# Patient Record
Sex: Male | Born: 1975 | Race: White | Hispanic: No | Marital: Single | Smoking: Never smoker
Health system: Southern US, Community
[De-identification: ages and names within clinical notes are randomized; demographics above are authoritative.]

---

## 2019-05-01 ENCOUNTER — Emergency Department (HOSPITAL_COMMUNITY): Payer: Self-pay

## 2019-05-01 ENCOUNTER — Other Ambulatory Visit: Payer: Self-pay

## 2019-05-01 ENCOUNTER — Encounter (HOSPITAL_COMMUNITY): Payer: Self-pay | Admitting: Emergency Medicine

## 2019-05-01 ENCOUNTER — Inpatient Hospital Stay (HOSPITAL_COMMUNITY)
Admission: EM | Admit: 2019-05-01 | Discharge: 2019-05-07 | DRG: 988 | Disposition: A | Discharge status: Home / Self Care | Payer: Self-pay | Attending: Internal Medicine | Admitting: Internal Medicine

## 2019-05-01 DIAGNOSIS — Y929 Unspecified place or not applicable: Secondary | ICD-10-CM

## 2019-05-01 DIAGNOSIS — L03114 Cellulitis of left upper limb: Secondary | ICD-10-CM | POA: Diagnosis present

## 2019-05-01 DIAGNOSIS — Z1159 Encounter for screening for other viral diseases: Secondary | ICD-10-CM

## 2019-05-01 DIAGNOSIS — L02414 Cutaneous abscess of left upper limb: Principal | ICD-10-CM | POA: Diagnosis present

## 2019-05-01 DIAGNOSIS — W230XXA Caught, crushed, jammed, or pinched between moving objects, initial encounter: Secondary | ICD-10-CM | POA: Diagnosis present

## 2019-05-01 DIAGNOSIS — F17200 Nicotine dependence, unspecified, uncomplicated: Secondary | ICD-10-CM | POA: Diagnosis present

## 2019-05-01 DIAGNOSIS — M25439 Effusion, unspecified wrist: Secondary | ICD-10-CM

## 2019-05-01 DIAGNOSIS — S61432A Puncture wound without foreign body of left hand, initial encounter: Secondary | ICD-10-CM | POA: Diagnosis present

## 2019-05-01 DIAGNOSIS — Y9389 Activity, other specified: Secondary | ICD-10-CM

## 2019-05-01 DIAGNOSIS — L02512 Cutaneous abscess of left hand: Secondary | ICD-10-CM | POA: Diagnosis present

## 2019-05-01 LAB — COMPREHENSIVE METABOLIC PANEL
ALT: 59 U/L — ABNORMAL HIGH (ref 0–44)
AST: 45 U/L — ABNORMAL HIGH (ref 15–41)
Albumin: 3.4 g/dL — ABNORMAL LOW (ref 3.5–5.0)
Alkaline Phosphatase: 85 U/L (ref 38–126)
Anion gap: 11 (ref 5–15)
BUN: 6 mg/dL (ref 6–20)
CO2: 26 mmol/L (ref 22–32)
Calcium: 8.7 mg/dL — ABNORMAL LOW (ref 8.9–10.3)
Chloride: 96 mmol/L — ABNORMAL LOW (ref 98–111)
Creatinine, Ser: 0.75 mg/dL (ref 0.61–1.24)
GFR calc Af Amer: >60 mL/min (ref >60)
GFR calc non Af Amer: >60 mL/min (ref >60)
Glucose, Bld: 132 mg/dL — ABNORMAL HIGH (ref 70–99)
Potassium: 4 mmol/L (ref 3.5–5.1)
Sodium: 133 mmol/L — ABNORMAL LOW (ref 135–145)
Total Bilirubin: 0.6 mg/dL (ref 0.3–1.2)
Total Protein: 6.7 g/dL (ref 6.5–8.1)

## 2019-05-01 LAB — CBC WITH DIFFERENTIAL/PLATELET
Abs Immature Granulocytes: 0.01 10*3/uL (ref 0.00–0.07)
Basophils Absolute: 0 10*3/uL (ref 0.0–0.1)
Basophils Relative: 1 %
Eosinophils Absolute: 0.1 10*3/uL (ref 0.0–0.5)
Eosinophils Relative: 2 %
HCT: 49.6 % (ref 39.0–52.0)
Hemoglobin: 16.6 g/dL (ref 13.0–17.0)
Immature Granulocytes: 0 %
Lymphocytes Relative: 15 %
Lymphs Abs: 0.9 10*3/uL (ref 0.7–4.0)
MCH: 31.4 pg (ref 26.0–34.0)
MCHC: 33.5 g/dL (ref 30.0–36.0)
MCV: 93.9 fL (ref 80.0–100.0)
Monocytes Absolute: 0.8 10*3/uL (ref 0.1–1.0)
Monocytes Relative: 14 %
Neutro Abs: 4 10*3/uL (ref 1.7–7.7)
Neutrophils Relative %: 68 %
Platelets: 190 10*3/uL (ref 150–400)
RBC: 5.28 MIL/uL (ref 4.22–5.81)
RDW: 12.8 % (ref 11.5–15.5)
WBC: 5.8 10*3/uL (ref 4.0–10.5)
nRBC: 0 % (ref 0.0–0.2)

## 2019-05-01 LAB — C-REACTIVE PROTEIN: CRP: 27.5 mg/dL — ABNORMAL HIGH (ref <1.0)

## 2019-05-01 LAB — LACTIC ACID, PLASMA: Lactic Acid, Venous: 1 mmol/L (ref 0.5–1.9)

## 2019-05-01 LAB — SEDIMENTATION RATE: Sed Rate: 10 mm/hr (ref 0–16)

## 2019-05-01 MED ORDER — CLINDAMYCIN PHOSPHATE 900 MG/50ML IV SOLN
900.0000 mg | Freq: Once | INTRAVENOUS | Status: AC
  Administered 2019-05-01: 900 mg via INTRAVENOUS
  Filled 2019-05-01: qty 50

## 2019-05-01 MED ORDER — IOHEXOL 300 MG/ML  SOLN
100.0000 mL | Freq: Once | INTRAMUSCULAR | Status: AC | PRN
  Administered 2019-05-01: 100 mL via INTRAVENOUS

## 2019-05-01 MED ORDER — TETANUS-DIPHTHERIA TOXOIDS TD 5-2 LFU IM INJ
0.5000 mL | INJECTION | Freq: Once | INTRAMUSCULAR | Status: AC
  Administered 2019-05-01: 0.5 mL via INTRAMUSCULAR
  Filled 2019-05-01: qty 0.5

## 2019-05-01 NOTE — ED Provider Notes (Signed)
[facility] EMERGENCY DEPARTMENT Provider Note   CSN: [id number] Arrival date & time: 05/01/19  1741    History   Chief Complaint Chief Complaint  Patient presents with  . Wrist Swelling/drainage    HPI Frank Blackwell is a 43 y.o. male.     The history is provided by the patient and medical records.  Hand Injury Location:  Hand Hand location:  Dorsum of L hand and L hand Injury: yes   Time since incident:  5 days Mechanism of injury: crush   Crush injury:    Mechanism: working on car and car part fell on hand. Pain details:    Quality:  Unable to specify   Radiates to:  Does not radiate   Severity:  Unable to specify   Onset quality:  Sudden   Duration:  5 days   Timing:  Constant   Progression:  Worsening Foreign body present:  Unable to specify Tetanus status:  Unknown Prior injury to area:  No Relieved by:  Nothing Worsened by:  Movement and stretching area Ineffective treatments:  Rest, ice, elevation and immobilization Associated symptoms: decreased range of motion, numbness and swelling   Associated symptoms: no fatigue and no fever   Risk factors: no concern for non-accidental trauma and no frequent fractures     History reviewed. No pertinent past medical history.  There are no active problems to display for this patient.   History reviewed. No pertinent surgical history.      Home Medications    Prior to Admission medications   Not on File    Family History No family history on file.  Social History Social History   Tobacco Use  . Smoking status: Never Smoker  . Smokeless tobacco: Never Used  Substance Use Topics  . Alcohol use: Yes  . Drug use: Never     Allergies   Patient has no known allergies.   Review of Systems Review of Systems  Constitutional: Negative for fatigue and fever.  All other systems reviewed and are negative.    Physical Exam Updated Vital Signs BP 135/87   Pulse 82   Temp 98.6 F  (37 C)   Resp 18   Wt 113.4 kg   SpO2 98%   Physical Exam Vitals signs and nursing note reviewed.  Constitutional:      Appearance: He is well-developed.  HENT:     Head: Normocephalic and atraumatic.  Eyes:     Conjunctiva/sclera: Conjunctivae normal.  Neck:     Musculoskeletal: Neck supple.  Cardiovascular:     Rate and Rhythm: Normal rate.  Pulmonary:     Effort: Pulmonary effort is normal. No respiratory distress.  Abdominal:     Palpations: Abdomen is soft.     Tenderness: There is no abdominal tenderness.  Musculoskeletal:        General: Swelling and tenderness present.     Comments: Erythema and swelling to dorsum of left hand, wrist, swelling is not circumferential Patient able to move fingers and wrist bilateral upper extremities spontaneously, range of motion left hand and wrist decreased secondary to swelling  No obvious tenderness with palpation of wrist or hand  2+ radial and ulnar pulses bilateral upper extremities  Spontaneous purulent drainage left hand  Skin:    General: Skin is warm and dry.     Capillary Refill: Capillary refill takes less than 2 seconds.  Neurological:     Mental Status: He is alert and oriented to person, place,  and time.      ED Treatments / Results  Labs (all labs ordered are listed, but only abnormal results are displayed) Labs Reviewed  C-REACTIVE PROTEIN - Abnormal; Notable for the following components:      Result Value   CRP 27.5 (*)    All other components within normal limits  COMPREHENSIVE METABOLIC PANEL - Abnormal; Notable for the following components:   Sodium 133 (*)    Chloride 96 (*)    Glucose, Bld 132 (*)    Calcium 8.7 (*)    Albumin 3.4 (*)    AST 45 (*)    ALT 59 (*)    All other components within normal limits  CULTURE, BLOOD (ROUTINE X 2)  CULTURE, BLOOD (ROUTINE X 2)  SARS CORONAVIRUS 2 (HOSPITAL ORDER, PERFORMED IN [facility])  SEDIMENTATION RATE  LACTIC ACID, PLASMA  CBC WITH  DIFFERENTIAL/PLATELET    EKG None  Radiology Dg Forearm Left  Result Date: 05/01/2019 CLINICAL DATA:  Swelling arm. Rule out septic joint. Injury 5 days ago EXAM: LEFT FOREARM - 2 VIEW COMPARISON:  None. FINDINGS: There is no evidence of fracture or other focal bone lesions. Soft tissues are unremarkable. IMPRESSION: Negative. Electronically Signed   By: Marlan Palauharles  Clark M.D.   On: 05/01/2019 19:00   Dg Wrist Complete Left  Result Date: 05/01/2019 CLINICAL DATA:  Left wrist pain in distal forearm pain. EXAM: LEFT WRIST - COMPLETE 3+ VIEW COMPARISON:  None. FINDINGS: There is no evidence of fracture or dislocation. There is no evidence of arthropathy or other focal bone abnormality. Soft tissues are unremarkable. IMPRESSION: Negative. Electronically Signed   By: Ted Mcalpineobrinka  Dimitrova M.D.   On: 05/01/2019 18:51   Ct Wrist Left W Contrast  Result Date: 05/01/2019 CLINICAL DATA:  Hand swelling, osteomyelitis suspected EXAM: CT OF THE UPPER LEFT EXTREMITY WITH CONTRAST TECHNIQUE: Multidetector CT imaging of the upper left extremity was performed according to the standard protocol following intravenous contrast administration. COMPARISON:  None. CONTRAST:  100mL OMNIPAQUE IOHEXOL 300 MG/ML  SOLN FINDINGS: Soft tissue swelling noted along the dorsum of the hand and wrist. Stranding within the subcutaneous soft tissues. No bone destruction to suggest osteomyelitis. No focal drainable fluid collection. No fracture, subluxation or dislocation. IMPRESSION: No acute bony abnormality.  No changes of osteomyelitis. Diffuse soft tissue swelling along the dorsum of the hand and wrist without drainable focal fluid collection. Electronically Signed   By: Charlett NoseKevin  Dover M.D.   On: 05/01/2019 22:53    Procedures Ultrasound ED Soft Tissue  Date/Time: 05/01/2019 11:00 PM Performed by: Erick Alleyasey, Aerika Groll, MD Authorized by: Erick Alleyasey, Derrall Hicks, MD   Procedure details:    Indications: evaluate for cellulitis     Transverse view:   Visualized   Longitudinal view:  Visualized   Images: archived   Location:    Location: upper extremity     Side:  Left Findings:     no abscess present    cellulitis present   (including critical care time)  Medications Ordered in ED Medications  clindamycin (CLEOCIN) IVPB 900 mg (0 mg Intravenous Stopped 05/01/19 2238)  tetanus & diphtheria toxoids (adult) (TENIVAC) injection 0.5 mL (0.5 mLs Intramuscular Given 05/01/19 2211)  iohexol (OMNIPAQUE) 300 MG/ML solution 100 mL (100 mLs Intravenous Contrast Given 05/01/19 2230)     Initial Impression / Assessment and Plan / ED Course  I have reviewed the triage vital signs and the nursing notes.  Pertinent labs & imaging results that were available during  my care of the patient were reviewed by me and considered in my medical decision making (see chart for details).        Medical Decision Making: Daniel NonesBrian Wolter is a 43 y.o. male who presented to the ED today with left hand, wrist erythema and swelling.  Reviewed and confirmed nursing documentation for past medical history, family history, social history.  On my initial exam, the pt was calm, cooperative, conversant, follows commands appropriately, GCS 15, not tachycardic, not hypotensive, afebrile, no increased work of breathing or respiratory distress, no signs of impending respiratory failure.   Concern for worsening infection left hand/wrist, erythema and swelling over the last few days since injury while working on a car, developed purulent drainage today, x-rays reveal no obvious fracture, dislocation, foreign body, no obvious tenderness with palpation Inflammatory markers elevated without any significant leukocytosis Patient given IV antibiotics. Ultrasound revealed changes consistent with cellulitis Will obtain CT scan to assess for abscess or drainable collection CT scan negative for any fluid collection All radiology and laboratory studies reviewed independently and with my  attending physician, agree with reading provided by radiologist unless otherwise noted.  Upon reassessing patient, patient was calm, resting comfortably, no new complaints Based on the above findings, I believe patient requires admission. Pt admitted The above care was discussed with and agreed upon by my attending physician. Emergency Department Medication Summary:  Medications  clindamycin (CLEOCIN) IVPB 900 mg (0 mg Intravenous Stopped 05/01/19 2238)  tetanus & diphtheria toxoids (adult) (TENIVAC) injection 0.5 mL (0.5 mLs Intramuscular Given 05/01/19 2211)  iohexol (OMNIPAQUE) 300 MG/ML solution 100 mL (100 mLs Intravenous Contrast Given 05/01/19 2230)   Final Clinical Impressions(s) / ED Diagnoses   Final diagnoses:  None    ED Discharge Orders    None       Erick Alleyasey, Tristian Bouska, MD 05/01/19 [id number]    Pricilla LovelessGoldston, Scott, MD 05/02/19 1226

## 2019-05-01 NOTE — ED Triage Notes (Signed)
Pt in with L wrist swelling/purulent drainage - sent from UC. Hx of contusion 5 days ago. Concerned for rapid onset of swelling and drainage to area 24 hrs ago. Concern for tenosynovitis w/likely intervention by hand surgeon ASAP today

## 2019-05-01 NOTE — Progress Notes (Signed)
Patient with dorsal FA/wrist swelling, elevated CRP, normal WBC and very slightly elevated ESR.    Asked for guidance regarding determining indication for hand surgery involvement--contrasted CT scan recommended to help determine presence of surgically-addressable abscess.  CT reveals SQ edema with no surgically-addressable fluid collection noted.    Patient being admitted to hospitalist care for parenteral antibiotics for presumed cellulitis.  Please re-consult hand surgery if need for surgical intervention emerges.  Micheline Rough, MD Hand Surgery

## 2019-05-02 ENCOUNTER — Other Ambulatory Visit: Payer: Self-pay

## 2019-05-02 ENCOUNTER — Encounter (HOSPITAL_COMMUNITY): Payer: Self-pay | Admitting: Internal Medicine

## 2019-05-02 LAB — CBC
HCT: 44.2 % (ref 39.0–52.0)
Hemoglobin: 15 g/dL (ref 13.0–17.0)
MCH: 30.9 pg (ref 26.0–34.0)
MCHC: 33.9 g/dL (ref 30.0–36.0)
MCV: 91.1 fL (ref 80.0–100.0)
Platelets: 164 10*3/uL (ref 150–400)
RBC: 4.85 MIL/uL (ref 4.22–5.81)
RDW: 12.8 % (ref 11.5–15.5)
WBC: 5.3 10*3/uL (ref 4.0–10.5)
nRBC: 0 % (ref 0.0–0.2)

## 2019-05-02 LAB — BASIC METABOLIC PANEL
Anion gap: 10 (ref 5–15)
BUN: 6 mg/dL (ref 6–20)
CO2: 26 mmol/L (ref 22–32)
Calcium: 8.3 mg/dL — ABNORMAL LOW (ref 8.9–10.3)
Chloride: 96 mmol/L — ABNORMAL LOW (ref 98–111)
Creatinine, Ser: 0.64 mg/dL (ref 0.61–1.24)
GFR calc Af Amer: >60 mL/min (ref >60)
GFR calc non Af Amer: >60 mL/min (ref >60)
Glucose, Bld: 112 mg/dL — ABNORMAL HIGH (ref 70–99)
Potassium: 4 mmol/L (ref 3.5–5.1)
Sodium: 132 mmol/L — ABNORMAL LOW (ref 135–145)

## 2019-05-02 LAB — SARS CORONAVIRUS 2: SARS Coronavirus 2: NOT DETECTED

## 2019-05-02 LAB — HIV ANTIBODY (ROUTINE TESTING W REFLEX): HIV Screen 4th Generation wRfx: NONREACTIVE

## 2019-05-02 MED ORDER — ACETAMINOPHEN 325 MG PO TABS
650.0000 mg | ORAL_TABLET | Freq: Four times a day (QID) | ORAL | Status: DC | PRN
  Administered 2019-05-02 – 2019-05-04 (×6): 650 mg via ORAL
  Filled 2019-05-02 (×7): qty 2

## 2019-05-02 MED ORDER — SODIUM CHLORIDE 0.9 % IV SOLN: 2.0000 g | INTRAVENOUS | Status: DC

## 2019-05-02 MED ORDER — ONDANSETRON HCL 4 MG PO TABS: 4.0000 mg | ORAL_TABLET | Freq: Four times a day (QID) | ORAL | Status: DC | PRN

## 2019-05-02 MED ORDER — OXYCODONE-ACETAMINOPHEN 5-325 MG PO TABS
1.0000 | ORAL_TABLET | ORAL | Status: DC | PRN
  Administered 2019-05-02 – 2019-05-03 (×2): 1 via ORAL
  Filled 2019-05-02 (×2): qty 1

## 2019-05-02 MED ORDER — ONDANSETRON HCL 4 MG/2ML IJ SOLN: 4.0000 mg | Freq: Four times a day (QID) | INTRAMUSCULAR | Status: DC | PRN

## 2019-05-02 MED ORDER — ACETAMINOPHEN 650 MG RE SUPP: 650.0000 mg | Freq: Four times a day (QID) | RECTAL | Status: DC | PRN

## 2019-05-02 MED ORDER — CLINDAMYCIN PHOSPHATE 900 MG/50ML IV SOLN
900.0000 mg | Freq: Three times a day (TID) | INTRAVENOUS | Status: DC
  Administered 2019-05-02 – 2019-05-03 (×5): 900 mg via INTRAVENOUS
  Filled 2019-05-02 (×8): qty 50

## 2019-05-02 NOTE — Progress Notes (Addendum)
Patient admitted after midnight. 43 yo no medical hx (has not been to doctor in many many years) admitted cellulitis to left wrist/hand. See h&P for details.   PE Gen: awake alert face flushed somewhat unkept but no acute distress CV: rrr no mgr no LE edema Resp: mild audible wheeze good air movement no crackles Ext: left hand with erythema and swelling and tenderness. Decreased rom to fingers and wrist. Some erythema up medial aspect of arm. Hand and arm warm to touch. Skin on top of hand shiny  A/P  1. Cellulitis of the left hand and wrist -cleocin started. Max temp 99.1. no leukocytosis. Non-toxic appearing. Will continue antibiotics and follow blood cultures. Pain management Monitor closely 2. History of tobacco abuse -tobacco cessation counseling requested   Santiago Glad m black, NP  Continue IV abx for cellulitis- if not improved in 24 hours consider changing.  Elevate extremity. JV

## 2019-05-02 NOTE — TOC Initial Note (Addendum)
Transition of Care [facility]) - Initial/Assessment Note    Patient Details  Name: Frank Blackwell MRN: [id number] Date of Birth: 1976-06-09  [facility] CM/SW Contact:    Marilu Favre, RN Phone Number: 05/02/2019, 12:48 PM  Clinical Narrative:                 Confirmed face sheet information with patient.   Patient lives alone but "sometimes I have someone stay with me."  Will follow for discharge needs.  Patient wants to follow up at [facility]  [address] [place] [address] [phone].  NCM attempting to schedule appointment with same. Left voicemail awaiting call back. Patient aware   Will follow for discharge prescription needs. If antibiotics needed please send prescriptions to [facility] and NCM will provide [facility]   Expected Discharge Plan: Home/Self Care Barriers to Discharge: Continued Medical Work up   Patient Goals and CMS Choice Patient states their goals for this hospitalization and ongoing recovery are:: to return to home CMS Medicare.gov Compare Post Acute Care list provided to:: Patient Choice offered to / list presented to : NA  Expected Discharge Plan and Services Expected Discharge Plan: Home/Self Care In-house Referral: Financial Counselor Discharge Planning Services: CM Consult, [facility], [place], Medication Assistance   Living arrangements for the past 2 months: Single Family Home                 DME Arranged: N/A         HH Arranged: NA          Prior Living Arrangements/Services Living arrangements for the past 2 months: Single Family Home Lives with:: Self Patient language and need for interpreter reviewed:: Yes Do you feel safe going back to the place where you live?: Yes      Need for Family Participation in Patient Care: No (Comment) Care giver support system in place?: Yes (comment)   Criminal Activity/Legal Involvement Pertinent to Current  Situation/Hospitalization: No - Comment as needed  Activities of Daily Living Home Assistive Devices/Equipment: None ADL Screening (condition at time of admission) Patient's cognitive ability adequate to safely complete daily activities?: Yes Is the patient deaf or have difficulty hearing?: No Does the patient have difficulty seeing, even when wearing glasses/contacts?: No Does the patient have difficulty concentrating, remembering, or making decisions?: No Patient able to express need for assistance with ADLs?: Yes Does the patient have difficulty dressing or bathing?: No Independently performs ADLs?: Yes (appropriate for developmental age) Does the patient have difficulty walking or climbing stairs?: No Weakness of Legs: None Weakness of Arms/Hands: None  Permission Sought/Granted   Permission granted to share information with : Yes, Verbal Permission Granted     Permission granted to share info w AGENCY: [facility]        Emotional Assessment Appearance:: Appears stated age Attitude/Demeanor/Rapport: Engaged Affect (typically observed): Accepting Orientation: : Oriented to Place, Oriented to Self, Oriented to  Time, Oriented to Situation Alcohol / Substance Use: Not Applicable Psych Involvement: No (comment)  Admission diagnosis:  Left Hand  Wrist Swelling Patient Active Problem List   Diagnosis Date Noted  . Cellulitis of left wrist 05/01/2019   PCP:  Patient, No Pcp Per Pharmacy:   [facility] [place] - [address] [place] [address] Phone: [phone] Fax: [phone]     Social  Determinants of Health (SDOH) Interventions    Readmission Risk Interventions No flowsheet data found.

## 2019-05-02 NOTE — ED Notes (Signed)
ED TO INPATIENT HANDOFF REPORT  ED Nurse Name and Phone #: Callie Fielding [phone]  S Name/Age/Gender Frank Blackwell 43 y.o. male Room/Bed: 014C/014C  Code Status   Code Status: Not on file  Home/SNF/Other Home Patient oriented to: self, place, time and situation Is this baseline? Yes   Triage Complete: Triage complete  Chief Complaint Left Hand & Wrist Swelling  Triage Note Pt in with L wrist swelling/purulent drainage - sent from UC. Hx of contusion 5 days ago. Concerned for rapid onset of swelling and drainage to area 24 hrs ago. Concern for tenosynovitis w/likely intervention by hand surgeon ASAP today   Allergies No Known Allergies  Level of Care/Admitting Diagnosis ED Disposition    ED Disposition Condition [place]: [facility] [100100]  Level of Care: Med-Surg [16]  I expect the patient will be discharged within 24 hours: No (not a candidate for 5C-Observation unit)  Covid Evaluation: Screening Protocol (No Symptoms)  Diagnosis: Cellulitis of left wrist [2595638]  Admitting Physician: Rise Patience [phone]  Attending Physician: Rise Patience [[id number]]  PT Class (Do Not Modify): Observation [104]  PT Acc Code (Do Not Modify): Observation [10022]       B Medical/Surgery History History reviewed. No pertinent past medical history. History reviewed. No pertinent surgical history.   A IV Location/Drains/Wounds Patient Lines/Drains/Airways Status   Active Line/Drains/Airways    Name:   Placement date:   Placement time:   Site:   Days:   Peripheral IV 05/01/19 Right Forearm   05/01/19    2200    Forearm   less than 1          Intake/Output Last 24 hours  Intake/Output Summary (Last 24 hours) at 05/01/2019 2359 Last data filed at 05/01/2019 2238 Gross per 24 hour  Intake 63.33 ml  Output -  Net 63.33 ml    Labs/Imaging Results for orders placed or performed during the hospital encounter of 05/01/19 (from  the past 48 hour(s))  Sedimentation rate     Status: None   Collection Time: 05/01/19  5:52 PM  Result Value Ref Range   Sed Rate 10 0 - 16 mm/hr    Comment: Performed at [facility], [place] [address], [place], [place] [address]  C-reactive protein     Status: Abnormal   Collection Time: 05/01/19  5:52 PM  Result Value Ref Range   CRP 27.5 (H) <1.0 mg/dL    Comment: Performed at [facility] [address], [place], [place] [address]  Lactic acid, plasma     Status: None   Collection Time: 05/01/19  5:52 PM  Result Value Ref Range   Lactic Acid, Venous 1.0 0.5 - 1.9 mmol/L    Comment: Performed at [facility] [address], [place], [place] [address]  Comprehensive metabolic panel     Status: Abnormal   Collection Time: 05/01/19  5:52 PM  Result Value Ref Range   Sodium 133 (L) 135 - 145 mmol/L   Potassium 4.0 3.5 - 5.1 mmol/L   Chloride 96 (L) 98 - 111 mmol/L   CO2 26 22 - 32 mmol/L   Glucose, Bld 132 (H) 70 - 99 mg/dL   BUN 6 6 - 20 mg/dL   Creatinine, Ser 0.75 0.61 - 1.24 mg/dL   Calcium 8.7 (L) 8.9 - 10.3 mg/dL   Total Protein 6.7 6.5 - 8.1 g/dL   Albumin 3.4 (L) 3.5 -  5.0 g/dL   AST 45 (H) 15 - 41 U/L   ALT 59 (H) 0 - 44 U/L   Alkaline Phosphatase 85 38 - 126 U/L   Total Bilirubin 0.6 0.3 - 1.2 mg/dL   GFR calc non Af Amer >60 >60 mL/min   GFR calc Af Amer >60 >60 mL/min   Anion gap 11 5 - 15    Comment: Performed at [facility], [address] [place], [place] [address]  CBC WITH DIFFERENTIAL     Status: None   Collection Time: 05/01/19  5:52 PM  Result Value Ref Range   WBC 5.8 4.0 - 10.5 K/uL   RBC 5.28 4.22 - 5.81 MIL/uL   Hemoglobin 16.6 13.0 - 17.0 g/dL   HCT 19.149.6 47.839.0 - 29.552.0 %   MCV 93.9 80.0 - 100.0 fL   MCH 31.4 26.0 - 34.0 pg   MCHC 33.5 30.0 - 36.0 g/dL   RDW 62.112.8 30.811.5 - 65.715.5 %   Platelets 190 150 - 400 K/uL   nRBC 0.0 0.0 - 0.2 %   Neutrophils Relative % 68 %   Neutro Abs 4.0 1.7 - 7.7 K/uL   Lymphocytes Relative 15 %    Lymphs Abs 0.9 0.7 - 4.0 K/uL   Monocytes Relative 14 %   Monocytes Absolute 0.8 0.1 - 1.0 K/uL   Eosinophils Relative 2 %   Eosinophils Absolute 0.1 0.0 - 0.5 K/uL   Basophils Relative 1 %   Basophils Absolute 0.0 0.0 - 0.1 K/uL   Immature Granulocytes 0 %   Abs Immature Granulocytes 0.01 0.00 - 0.07 K/uL    Comment: Performed at [facility], [address] [place], [place] [address]   Dg Forearm Left  Result Date: 05/01/2019 CLINICAL DATA:  Swelling arm. Rule out septic joint. Injury 5 days ago EXAM: LEFT FOREARM - 2 VIEW COMPARISON:  None. FINDINGS: There is no evidence of fracture or other focal bone lesions. Soft tissues are unremarkable. IMPRESSION: Negative. Electronically Signed   By: Marlan Palauharles  Clark M.D.   On: 05/01/2019 19:00   Dg Wrist Complete Left  Result Date: 05/01/2019 CLINICAL DATA:  Left wrist pain in distal forearm pain. EXAM: LEFT WRIST - COMPLETE 3+ VIEW COMPARISON:  None. FINDINGS: There is no evidence of fracture or dislocation. There is no evidence of arthropathy or other focal bone abnormality. Soft tissues are unremarkable. IMPRESSION: Negative. Electronically Signed   By: Ted Mcalpineobrinka  Dimitrova M.D.   On: 05/01/2019 18:51   Ct Wrist Left W Contrast  Result Date: 05/01/2019 CLINICAL DATA:  Hand swelling, osteomyelitis suspected EXAM: CT OF THE UPPER LEFT EXTREMITY WITH CONTRAST TECHNIQUE: Multidetector CT imaging of the upper left extremity was performed according to the standard protocol following intravenous contrast administration. COMPARISON:  None. CONTRAST:  100mL OMNIPAQUE IOHEXOL 300 MG/ML  SOLN FINDINGS: Soft tissue swelling noted along the dorsum of the hand and wrist. Stranding within the subcutaneous soft tissues. No bone destruction to suggest osteomyelitis. No focal drainable fluid collection. No fracture, subluxation or dislocation. IMPRESSION: No acute bony abnormality.  No changes of osteomyelitis. Diffuse soft tissue swelling along the dorsum of  the hand and wrist without drainable focal fluid collection. Electronically Signed   By: Charlett NoseKevin  Dover M.D.   On: 05/01/2019 22:53    Pending Labs Unresulted Labs (From admission, onward)    Start     Ordered   05/01/19 2209  SARS Coronavirus 2  Once,   R  05/01/19 2209   05/01/19 1752  Blood Culture (routine x 2)  BLOOD CULTURE X 2,   STAT     05/01/19 1752          Vitals/Pain Today's Vitals   05/01/19 1759 05/01/19 2030 05/01/19 2045 05/01/19 2100  BP: (!) 148/98 123/77 126/81 135/87  Pulse: 94 81 87 82  Resp: 18     Temp: 98.6 F (37 C)     SpO2: 100% 98% 97% 98%  Weight:        Isolation Precautions No active isolations  Medications Medications  clindamycin (CLEOCIN) IVPB 900 mg (0 mg Intravenous Stopped 05/01/19 2238)  tetanus & diphtheria toxoids (adult) (TENIVAC) injection 0.5 mL (0.5 mLs Intramuscular Given 05/01/19 2211)  iohexol (OMNIPAQUE) 300 MG/ML solution 100 mL (100 mLs Intravenous Contrast Given 05/01/19 2230)    Mobility walks Low fall risk   Focused Assessments Cardiac Assessment Handoff:    No results found for: CKTOTAL, CKMB, CKMBINDEX, TROPONINI No results found for: DDIMER Does the Patient currently have chest pain? No      R Recommendations: See Admitting Provider Note  Report given to:   Additional Notes: Left hand and wrist has redness and cellulitis from injury at work on a car.

## 2019-05-02 NOTE — H&P (Signed)
History and Physical    Kraven Calk HKV:[id number] DOB: 01-08-1976 DOA: 05/01/2019  PCP: Patient, No Pcp Per  Patient coming from: Home.  Chief Complaint: Left wrist pain and swelling.  HPI: Frank Blackwell is a 43 y.o. male with no significant past medical history presents to the ER with worsening swelling and drainage from the left wrist and hand.  Patient states he had a injury while working when a weight dropped onto his hand and also punctured his skin.  Had gone to urgent care following day and x-rays as per the patient did not show anything acute.  Following which patient's symptoms worsen with patient started noticing increasing drainage and decreased mobility of his wrist.  ED Course: In the ER CT scan of the wrist does not show any definite abscess collection.  Does show soft tissue swelling.  No definite evidence of any osteomyelitis.  Dr. Grandville Silos on-call hand surgeon was consulted but since there was no drainable abscess no surgical procedure was conducted.  Patient was started on clindamycin admitted for further observation.  Review of Systems: As per HPI, rest all negative.   History reviewed. No pertinent past medical history.  History reviewed. No pertinent surgical history.   reports that he has never smoked. He has never used smokeless tobacco. He reports current alcohol use. He reports that he does not use drugs.  No Known Allergies  Family History  Problem Relation Age of Onset  . Diabetes Mellitus II Neg Hx   . CAD Neg Hx     Prior to Admission medications   Not on File    Physical Exam: Vitals:   05/01/19 2100 05/02/19 0022 05/02/19 0024 05/02/19 0048  BP: 135/87 130/86  (!) 155/91  Pulse: 82  84 85  Resp:    18  Temp:    99.1 F (37.3 C)  TempSrc:    Oral  SpO2: 98%  95% 99%  Weight:          Constitutional: Moderately built and nourished. Vitals:   05/01/19 2100 05/02/19 0022 05/02/19 0024 05/02/19 0048  BP: 135/87 130/86  (!) 155/91  Pulse:  82  84 85  Resp:    18  Temp:    99.1 F (37.3 C)  TempSrc:    Oral  SpO2: 98%  95% 99%  Weight:       Eyes: Anicteric no pallor. ENMT: No discharge from the ears eyes nose and mouth. Neck: No mass felt.  No neck rigidity. Respiratory: No rhonchi or crepitations. Cardiovascular: S1-S2 heard. Abdomen: Soft nontender bowel sounds present. Musculoskeletal: Swelling of the left hand and wrist.  Able to move but decreased range of movement.  Pulses felt. Skin: Erythema of the left hand. Neurologic: Alert awake oriented to time place and person.  Moves all extremities. Psychiatric: Appears normal.  Normal affect.   Labs on Admission: I have personally reviewed following labs and imaging studies  CBC: Recent Labs  Lab 05/01/19 1752  WBC 5.8  NEUTROABS 4.0  HGB 16.6  HCT 49.6  MCV 93.9  PLT 564   Basic Metabolic Panel: Recent Labs  Lab 05/01/19 1752  NA 133*  K 4.0  CL 96*  CO2 26  GLUCOSE 132*  BUN 6  CREATININE 0.75  CALCIUM 8.7*   GFR: CrCl cannot be calculated (Unknown ideal weight.). Liver Function Tests: Recent Labs  Lab 05/01/19 1752  AST 45*  ALT 59*  ALKPHOS 85  BILITOT 0.6  PROT 6.7  ALBUMIN 3.4*  No results for input(s): LIPASE, AMYLASE in the last 168 hours. No results for input(s): AMMONIA in the last 168 hours. Coagulation Profile: No results for input(s): INR, PROTIME in the last 168 hours. Cardiac Enzymes: No results for input(s): CKTOTAL, CKMB, CKMBINDEX, TROPONINI in the last 168 hours. BNP (last 3 results) No results for input(s): PROBNP in the last 8760 hours. HbA1C: No results for input(s): HGBA1C in the last 72 hours. CBG: No results for input(s): GLUCAP in the last 168 hours. Lipid Profile: No results for input(s): CHOL, HDL, LDLCALC, TRIG, CHOLHDL, LDLDIRECT in the last 72 hours. Thyroid Function Tests: No results for input(s): TSH, T4TOTAL, FREET4, T3FREE, THYROIDAB in the last 72 hours. Anemia Panel: No results for  input(s): VITAMINB12, FOLATE, FERRITIN, TIBC, IRON, RETICCTPCT in the last 72 hours. Urine analysis: No results found for: COLORURINE, APPEARANCEUR, LABSPEC, PHURINE, GLUCOSEU, HGBUR, BILIRUBINUR, KETONESUR, PROTEINUR, UROBILINOGEN, NITRITE, LEUKOCYTESUR Sepsis Labs: @LABRCNTIP (procalcitonin:4,lacticidven:4) )No results found for this or any previous visit (from the past 240 hour(s)).   Radiological Exams on Admission: Dg Forearm Left  Result Date: 05/01/2019 CLINICAL DATA:  Swelling arm. Rule out septic joint. Injury 5 days ago EXAM: LEFT FOREARM - 2 VIEW COMPARISON:  None. FINDINGS: There is no evidence of fracture or other focal bone lesions. Soft tissues are unremarkable. IMPRESSION: Negative. Electronically Signed   By: Marlan Palauharles  Clark M.D.   On: 05/01/2019 19:00   Dg Wrist Complete Left  Result Date: 05/01/2019 CLINICAL DATA:  Left wrist pain in distal forearm pain. EXAM: LEFT WRIST - COMPLETE 3+ VIEW COMPARISON:  None. FINDINGS: There is no evidence of fracture or dislocation. There is no evidence of arthropathy or other focal bone abnormality. Soft tissues are unremarkable. IMPRESSION: Negative. Electronically Signed   By: Ted Mcalpineobrinka  Dimitrova M.D.   On: 05/01/2019 18:51   Ct Wrist Left W Contrast  Result Date: 05/01/2019 CLINICAL DATA:  Hand swelling, osteomyelitis suspected EXAM: CT OF THE UPPER LEFT EXTREMITY WITH CONTRAST TECHNIQUE: Multidetector CT imaging of the upper left extremity was performed according to the standard protocol following intravenous contrast administration. COMPARISON:  None. CONTRAST:  100mL OMNIPAQUE IOHEXOL 300 MG/ML  SOLN FINDINGS: Soft tissue swelling noted along the dorsum of the hand and wrist. Stranding within the subcutaneous soft tissues. No bone destruction to suggest osteomyelitis. No focal drainable fluid collection. No fracture, subluxation or dislocation. IMPRESSION: No acute bony abnormality.  No changes of osteomyelitis. Diffuse soft tissue swelling  along the dorsum of the hand and wrist without drainable focal fluid collection. Electronically Signed   By: Charlett NoseKevin  Dover M.D.   On: 05/01/2019 22:53     Assessment/Plan Principal Problem:   Cellulitis of left wrist    1. Cellulitis of the left hand and wrist -patient is on empiric antibiotics for now.  Closely observe clinically.  Appreciate orthopedic Dr. Carollee Massedhompson's recommendations. 2. History of tobacco abuse -tobacco cessation counseling requested.   DVT prophylaxis: SCDs in anticipation of possible procedure. Code Status: Full code. Family Communication: Discussed with patient. Disposition Plan: Home. Consults called: Dr. Janee Mornhompson was consulted by ER physician. Admission status: Observation.   Eduard ClosArshad N Mervil Wacker MD [facility] Pager [phone].  If 7PM-7AM, please contact night-coverage [email or web address] Password [email or web address]  05/02/2019, 1:11 AM

## 2019-05-03 DIAGNOSIS — F17200 Nicotine dependence, unspecified, uncomplicated: Secondary | ICD-10-CM | POA: Diagnosis present

## 2019-05-03 LAB — CBC
HCT: 43.5 % (ref 39.0–52.0)
Hemoglobin: 15 g/dL (ref 13.0–17.0)
MCH: 31.3 pg (ref 26.0–34.0)
MCHC: 34.5 g/dL (ref 30.0–36.0)
MCV: 90.8 fL (ref 80.0–100.0)
Platelets: 173 10*3/uL (ref 150–400)
RBC: 4.79 MIL/uL (ref 4.22–5.81)
RDW: 12.7 % (ref 11.5–15.5)
WBC: 6 10*3/uL (ref 4.0–10.5)
nRBC: 0 % (ref 0.0–0.2)

## 2019-05-03 LAB — BASIC METABOLIC PANEL
Anion gap: 10 (ref 5–15)
BUN: 8 mg/dL (ref 6–20)
CO2: 26 mmol/L (ref 22–32)
Calcium: 8.5 mg/dL — ABNORMAL LOW (ref 8.9–10.3)
Chloride: 99 mmol/L (ref 98–111)
Creatinine, Ser: 0.66 mg/dL (ref 0.61–1.24)
GFR calc Af Amer: >60 mL/min (ref >60)
GFR calc non Af Amer: >60 mL/min (ref >60)
Glucose, Bld: 109 mg/dL — ABNORMAL HIGH (ref 70–99)
Potassium: 4.1 mmol/L (ref 3.5–5.1)
Sodium: 135 mmol/L (ref 135–145)

## 2019-05-03 MED ORDER — VANCOMYCIN HCL 10 G IV SOLR
2000.0000 mg | Freq: Once | INTRAVENOUS | Status: AC
  Administered 2019-05-03: 2000 mg via INTRAVENOUS
  Filled 2019-05-03: qty 2000

## 2019-05-03 MED ORDER — PIPERACILLIN-TAZOBACTAM 3.375 G IVPB
3.3750 g | Freq: Three times a day (TID) | INTRAVENOUS | Status: DC
  Administered 2019-05-03 – 2019-05-07 (×12): 3.375 g via INTRAVENOUS
  Filled 2019-05-03 (×12): qty 50

## 2019-05-03 MED ORDER — VANCOMYCIN HCL 10 G IV SOLR
1500.0000 mg | Freq: Two times a day (BID) | INTRAVENOUS | Status: DC
  Administered 2019-05-03 – 2019-05-07 (×8): 1500 mg via INTRAVENOUS
  Filled 2019-05-03 (×10): qty 1500

## 2019-05-03 MED ORDER — PIPERACILLIN-TAZOBACTAM 3.375 G IVPB 30 MIN
3.3750 g | Freq: Once | INTRAVENOUS | Status: AC
  Administered 2019-05-03: 09:00:00 3.375 g via INTRAVENOUS
  Filled 2019-05-03: qty 50

## 2019-05-03 NOTE — Progress Notes (Signed)
Pharmacy Antibiotic Note  Stokes Rattigan is a 43 y.o. male admitted on 05/01/2019 with cellulitis.  Pharmacy has been consulted for Vancocin and Zosyn dosing.  Plan: Vancomycin 2000mg  x1 then 1500mg  IV Q12H. Goal AUC 400-550.  Expected AUC 465. SCr used 0.8. Zosyn 3.375g IV Q8H (4-hour infusion).  Height: 6' (182.9 cm) Weight: 250 lb (113.4 kg) IBW/kg (Calculated) : 77.6  Temp (24hrs), Avg:99 F (37.2 C), Min:98.3 F (36.8 C), Max:100.4 F (38 C)  Recent Labs  Lab 05/01/19 1752 05/02/19 0323 05/03/19 0312  WBC 5.8 5.3 6.0  CREATININE 0.75 0.64 0.66  LATICACIDVEN 1.0  --   --     Estimated Creatinine Clearance: 156.4 mL/min (by C-G formula based on SCr of 0.66 mg/dL).    No Known Allergies   Thank you for allowing pharmacy to be a part of this patient's care.  Wynona Neat, PharmD, BCPS  05/03/2019 7:45 AM

## 2019-05-03 NOTE — Progress Notes (Signed)
[facility] PROGRESS NOTE  Frank NonesBrian Blackwell ZOX:[id number] DOB: 12/23/1975 DOA: 05/01/2019 PCP: Patient, No Pcp Per  Assessment/Plan:  1. Cellulitis of the left hand and wrist-cleocin started. Max temp 100.4. no leukocytosis. Non-toxic appearing. Hand a little less puffy and a little less red. Remains hot and very tender to movement. Will broaden antibiotics and encourage pain medicine. Elevate. If no further improvement tomorrow consider further imaging and possible consult.  2. History of tobacco abuse-tobacco cessation counseling requested    Code Status: full Family Communication: patient Disposition Plan: home hopefully in 1-2 days   Consultants:    Procedures:    Antibiotics:  Clindamycin 6/16-6/17  Vancomycin 6/17>>>  Zosyn 6/17>>>>  HPI/Subjective: 43 yo admitted with hand/wrist injury/cellulitis. No leukocytosis but max temp 100.4 last night.   Objective: Vitals:   05/03/19 0549 05/03/19 1024  BP: 129/83 116/83  Pulse: 72 72  Resp: 18 15  Temp: 98.8 F (37.1 C) 98.3 F (36.8 C)  SpO2: 95% 95%    Intake/Output Summary (Last 24 hours) at 05/03/2019 1304 Last data filed at 05/03/2019 [id number] Gross per 24 hour  Intake 510 ml  Output -  Net 510 ml   Filed Weights   05/01/19 1755  Weight: 113.4 kg    Exam:   General:  Awake alert no acute distress  Cardiovascular: rrr no mgr no LE edema  Respiratory: normal effort BS clear bilaterally no wheeze  Abdomen: non-distended non-tender +BS no guarding or rebounding  Musculoskeletal: left hand with edema, erythema, heat and pain in wrist with pronation. Draining moderate amount clear drainage. No odor   Data Reviewed: Basic Metabolic Panel: Recent Labs  Lab 05/01/19 1752 05/02/19 0323 05/03/19 0312  NA 133* 132* 135  K 4.0 4.0 4.1  CL 96* 96* 99  CO2 26 26 26   GLUCOSE 132* 112* 109*  BUN 6 6 8   CREATININE 0.75 0.64 0.66  CALCIUM 8.7* 8.3* 8.5*   Liver Function Tests: Recent Labs   Lab 05/01/19 1752  AST 45*  ALT 59*  ALKPHOS 85  BILITOT 0.6  PROT 6.7  ALBUMIN 3.4*   No results for input(s): LIPASE, AMYLASE in the last 168 hours. No results for input(s): AMMONIA in the last 168 hours. CBC: Recent Labs  Lab 05/01/19 1752 05/02/19 0323 05/03/19 0312  WBC 5.8 5.3 6.0  NEUTROABS 4.0  --   --   HGB 16.6 15.0 15.0  HCT 49.6 44.2 43.5  MCV 93.9 91.1 90.8  PLT 190 164 173   Cardiac Enzymes: No results for input(s): CKTOTAL, CKMB, CKMBINDEX, TROPONINI in the last 168 hours. BNP (last 3 results) No results for input(s): BNP in the last 8760 hours.  ProBNP (last 3 results) No results for input(s): PROBNP in the last 8760 hours.  CBG: No results for input(s): GLUCAP in the last 168 hours.  Recent Results (from the past 240 hour(s))  Blood Culture (routine x 2)     Status: None (Preliminary result)   Collection Time: 05/01/19  6:00 PM   Specimen: BLOOD RIGHT ARM  Result Value Ref Range Status   Specimen Description BLOOD RIGHT ARM  Final   Special Requests   Final    BOTTLES DRAWN AEROBIC AND ANAEROBIC Blood Culture results may not be optimal due to an excessive volume of blood received in culture bottles   Culture   Final    NO GROWTH < 24 HOURS Performed at [facility], [address] [place], [place] [id number]  Report Status PENDING  Incomplete  Blood Culture (routine x 2)     Status: None (Preliminary result)   Collection Time: 05/01/19  8:54 PM   Specimen: BLOOD  Result Value Ref Range Status   Specimen Description BLOOD RIGHT FOREARM  Final   Special Requests   Final    BOTTLES DRAWN AEROBIC AND ANAEROBIC Blood Culture adequate volume   Culture   Final    NO GROWTH < 24 HOURS Performed at [facility], [place] [address], [place], [place] [address]    Report Status PENDING  Incomplete  SARS Coronavirus 2     Status: None   Collection Time: 05/01/19 10:09 PM  Result Value Ref Range Status   SARS Coronavirus 2 NOT DETECTED NOT  DETECTED Final    Comment: (NOTE) SARS-CoV-2 target nucleic acids are NOT DETECTED. The SARS-CoV-2 RNA is generally detectable in upper and lower respiratory specimens during the acute phase of infection.  Negative  results do not preclude SARS-CoV-2 infection, do not rule out co-infections with other pathogens, and should not be used as the sole basis for treatment or other patient management decisions.  Negative results must be combined with clinical observations, patient history, and epidemiological information. The expected result is Not Detected. Fact Sheet for Patients: http://www.biofiredefense.com/wp-content/uploads/2020/03/BIOFIRE-COVID -19-patients.pdf Fact Sheet for Healthcare Providers: http://www.biofiredefense.com/wp-content/uploads/2020/03/BIOFIRE-COVID -19-hcp.pdf This test is not yet approved or cleared by the [place] and  has been authorized for detection and/or diagnosis of SARS-CoV-2 by FDA under an Emergency Use Authorization (EUA).  This EUA will remain in effec t (meaning this test can be used) for the duration of  the COVID-19 declaration under Section 564(b)(1) of the Act, 21 U.S.C. section 360bbb-3(b)(1), unless the authorization is terminated or revoked sooner. Performed at [facility], [place] [address], [place], [place] [address]      Studies: Dg Forearm Left  Result Date: 05/01/2019 CLINICAL DATA:  Swelling arm. Rule out septic joint. Injury 5 days ago EXAM: LEFT FOREARM - 2 VIEW COMPARISON:  None. FINDINGS: There is no evidence of fracture or other focal bone lesions. Soft tissues are unremarkable. IMPRESSION: Negative. Electronically Signed   By: Franchot Gallo M.D.   On: 05/01/2019 19:00   Dg Wrist Complete Left  Result Date: 05/01/2019 CLINICAL DATA:  Left wrist pain in distal forearm pain. EXAM: LEFT WRIST - COMPLETE 3+ VIEW COMPARISON:  None. FINDINGS: There is no evidence of fracture or dislocation. There is no evidence of  arthropathy or other focal bone abnormality. Soft tissues are unremarkable. IMPRESSION: Negative. Electronically Signed   By: Fidela Salisbury M.D.   On: 05/01/2019 18:51   Ct Wrist Left W Contrast  Result Date: 05/01/2019 CLINICAL DATA:  Hand swelling, osteomyelitis suspected EXAM: CT OF THE UPPER LEFT EXTREMITY WITH CONTRAST TECHNIQUE: Multidetector CT imaging of the upper left extremity was performed according to the standard protocol following intravenous contrast administration. COMPARISON:  None. CONTRAST:  164mL OMNIPAQUE IOHEXOL 300 MG/ML  SOLN FINDINGS: Soft tissue swelling noted along the dorsum of the hand and wrist. Stranding within the subcutaneous soft tissues. No bone destruction to suggest osteomyelitis. No focal drainable fluid collection. No fracture, subluxation or dislocation. IMPRESSION: No acute bony abnormality.  No changes of osteomyelitis. Diffuse soft tissue swelling along the dorsum of the hand and wrist without drainable focal fluid collection. Electronically Signed   By: Rolm Baptise M.D.   On: 05/01/2019 22:53    Scheduled Meds: Continuous Infusions: . piperacillin-tazobactam (ZOSYN)  IV    .  vancomycin      Principal Problem:   Cellulitis of left wrist Active Problems:   Tobacco use disorder    Time spent: 45 minutes    [facility] LLCBLACK,Mazzy Santarelli M NP  [facility]  If 7PM-7AM, please contact night-coverage at [email or web address], password [email or web address] 05/03/2019, 1:04 PM  LOS: 1 day

## 2019-05-03 NOTE — Progress Notes (Signed)
Patient spoke with his brother today. Patient states no reason for RN to call for update.

## 2019-05-03 NOTE — TOC Progression Note (Addendum)
Transition of Care [facility]) - Progression Note    Patient Details  Name: Frank Blackwell MRN: [id number] Date of Birth: [birth year]  [facility] CM/SW Contact  Jacalyn Lefevre Edson Snowball, RN Phone Number: 05/03/2019, 12:40 PM  Clinical Narrative:     Damaris Schooner with Cassandra at [facility], [facility] scheduled appointment for May 17, 2019 at 1:20 pm with Dr Rupert Stacks.  Cassandra asked NCM to ask patient to bring proof of income ( last 4 pay stubs if he has them ), proof of address ( water bill,etc) and photo ID.   Patient aware of above and voiced understanding .  Will continue to follow for discharge prescriptions.  Expected Discharge Plan: Home/Self Care Barriers to Discharge: Continued Medical Work up  Expected Discharge Plan and Services Expected Discharge Plan: Home/Self Care In-house Referral: Financial Counselor Discharge Planning Services: CM Consult, [facility], [place], Medication Assistance   Living arrangements for the past 2 months: Single Family Home                 DME Arranged: N/A         HH Arranged: NA           Social Determinants of Health (SDOH) Interventions    Readmission Risk Interventions No flowsheet data found.

## 2019-05-04 LAB — CBC
HCT: 44.9 % (ref 39.0–52.0)
Hemoglobin: 15 g/dL (ref 13.0–17.0)
MCH: 30.3 pg (ref 26.0–34.0)
MCHC: 33.4 g/dL (ref 30.0–36.0)
MCV: 90.7 fL (ref 80.0–100.0)
Platelets: 172 10*3/uL (ref 150–400)
RBC: 4.95 MIL/uL (ref 4.22–5.81)
RDW: 12.7 % (ref 11.5–15.5)
WBC: 6.7 10*3/uL (ref 4.0–10.5)
nRBC: 0 % (ref 0.0–0.2)

## 2019-05-04 MED ORDER — IBUPROFEN 400 MG PO TABS: 400.0000 mg | ORAL_TABLET | Freq: Four times a day (QID) | ORAL | Status: DC | PRN

## 2019-05-04 NOTE — Plan of Care (Signed)
  Problem: Pain Managment: Goal: General experience of comfort will improve Outcome: Progressing   Problem: Safety: Goal: Ability to remain free from injury will improve Outcome: Progressing   Problem: Skin Integrity: Goal: Skin integrity will improve Outcome: Progressing

## 2019-05-04 NOTE — Progress Notes (Signed)
[facility] PROGRESS NOTE  Frank Blackwell ZOX:[name] DOB: August 26, 1976 DOA: 05/01/2019 PCP: Patient, No Pcp Per  Assessment/Plan:  1. Cellulitis of the left hand and wrist- less red, less swollen less pain. Greater ROM of fingers. Light pink streak along inner aspect of left arm up to axilla. cleocin stopped 6/17. Started vanc and zosyn 6/17. Afebrile,no leukocytosis. Non-toxic appearing. continue vanc and zosyn. Keep elevated. Monitor closely  2. History of tobacco abuse-tobacco cessation counseling requested   Code Status: full Family Communication: patient Disposition Plan: home hopefully tomorrow   Consultants:    Procedures:    Antibiotics:  Clindamycin 6/16-6/17  Vancomycin 6/17>>>  Zosyn 6/17>>>>  HPI/Subjective: 43 yo admitted with hand/wrist injury/cellulitis. Antibiotics broadened 6/17 and starting to improve. Afebrile non-toxic appearing.   Objective: Vitals:   05/03/19 2044 05/04/19 0523  BP: 117/79 135/80  Pulse: 66 66  Resp: 18 17  Temp: 98.7 F (37.1 C) (!) 97.4 F (36.3 C)  SpO2: 96% 96%    Intake/Output Summary (Last 24 hours) at 05/04/2019 1311 Last data filed at 05/04/2019 1012 Gross per 24 hour  Intake 1630 ml  Output -  Net 1630 ml   Filed Weights   05/01/19 1755  Weight: 113.4 kg    Exam:   General:  Sitting in chair watching TV. No acute distress  Cardiovascular: rrr no mgr no LE edema  Respiratory: normal effort BS clear bilaterally no wheeze  Abdomen: non-distended non-tender +BS no guarding or rebounding  Musculoskeletal: left hand/wrist with dressing with small amount yellowish drainage. Hand with mild erythema and moderate swelling extending to fingers. Pain with pronation. Light pink streak inner aspect of arm up to axilla.    Data Reviewed: Basic Metabolic Panel: Recent Labs  Lab 05/01/19 1752 05/02/19 0323 05/03/19 0312  NA 133* 132* 135  K 4.0 4.0 4.1  CL 96* 96* 99  CO2 26 26 26   GLUCOSE 132*  112* 109*  BUN 6 6 8   CREATININE 0.75 0.64 0.66  CALCIUM 8.7* 8.3* 8.5*   Liver Function Tests: Recent Labs  Lab 05/01/19 1752  AST 45*  ALT 59*  ALKPHOS 85  BILITOT 0.6  PROT 6.7  ALBUMIN 3.4*   No results for input(s): LIPASE, AMYLASE in the last 168 hours. No results for input(s): AMMONIA in the last 168 hours. CBC: Recent Labs  Lab 05/01/19 1752 05/02/19 0323 05/03/19 0312 05/04/19 0237  WBC 5.8 5.3 6.0 6.7  NEUTROABS 4.0  --   --   --   HGB 16.6 15.0 15.0 15.0  HCT 49.6 44.2 43.5 44.9  MCV 93.9 91.1 90.8 90.7  PLT 190 164 173 172   Cardiac Enzymes: No results for input(s): CKTOTAL, CKMB, CKMBINDEX, TROPONINI in the last 168 hours. BNP (last 3 results) No results for input(s): BNP in the last 8760 hours.  ProBNP (last 3 results) No results for input(s): PROBNP in the last 8760 hours.  CBG: No results for input(s): GLUCAP in the last 168 hours.  Recent Results (from the past 240 hour(s))  Blood Culture (routine x 2)     Status: None (Preliminary result)   Collection Time: 05/01/19  6:00 PM   Specimen: BLOOD RIGHT ARM  Result Value Ref Range Status   Specimen Description BLOOD RIGHT ARM  Final   Special Requests   Final    BOTTLES DRAWN AEROBIC AND ANAEROBIC Blood Culture results may not be optimal due to an excessive volume of blood received in culture bottles   Culture  Final    NO GROWTH 2 DAYS Performed at [facility], [place] [address], [place], [place] [address]    Report Status PENDING  Incomplete  Blood Culture (routine x 2)     Status: None (Preliminary result)   Collection Time: 05/01/19  8:54 PM   Specimen: BLOOD  Result Value Ref Range Status   Specimen Description BLOOD RIGHT FOREARM  Final   Special Requests   Final    BOTTLES DRAWN AEROBIC AND ANAEROBIC Blood Culture adequate volume   Culture   Final    NO GROWTH 2 DAYS Performed at [facility], [place] [address] [place], [place] [address]    Report Status PENDING   Incomplete  SARS Coronavirus 2     Status: None   Collection Time: 05/01/19 10:09 PM  Result Value Ref Range Status   SARS Coronavirus 2 NOT DETECTED NOT DETECTED Final    Comment: (NOTE) SARS-CoV-2 target nucleic acids are NOT DETECTED. The SARS-CoV-2 RNA is generally detectable in upper and lower respiratory specimens during the acute phase of infection.  Negative  results do not preclude SARS-CoV-2 infection, do not rule out co-infections with other pathogens, and should not be used as the sole basis for treatment or other patient management decisions.  Negative results must be combined with clinical observations, patient history, and epidemiological information. The expected result is Not Detected. Fact Sheet for Patients: http://www.biofiredefense.com/wp-content/uploads/2020/03/BIOFIRE-COVID -19-patients.pdf Fact Sheet for Healthcare Providers: http://www.biofiredefense.com/wp-content/uploads/2020/03/BIOFIRE-COVID -19-hcp.pdf This test is not yet approved or cleared by the [place] and  has been authorized for detection and/or diagnosis of SARS-CoV-2 by FDA under an Emergency Use Authorization (EUA).  This EUA will remain in effec t (meaning this test can be used) for the duration of  the COVID-19 declaration under Section 564(b)(1) of the Act, 21 U.S.C. section 360bbb-3(b)(1), unless the authorization is terminated or revoked sooner. Performed at [facility], [place] [address], [place],  [address]      Studies: No results found.  Scheduled Meds: Continuous Infusions: . piperacillin-tazobactam (ZOSYN)  IV 3.375 g (05/04/19 0940)  . vancomycin 1,500 mg (05/04/19 0747)    Principal Problem:   Cellulitis of left wrist Active Problems:   Tobacco use disorder    Time spent: 58 minutes    Dover NP  [facility]  If 7PM-7AM, please contact night-coverage at [email or web address], password  [facility] 05/04/2019, 1:11 PM  LOS: 2 days

## 2019-05-05 ENCOUNTER — Inpatient Hospital Stay (HOSPITAL_COMMUNITY): Payer: Self-pay

## 2019-05-05 LAB — CBC
HCT: 43 % (ref 39.0–52.0)
Hemoglobin: 14.5 g/dL (ref 13.0–17.0)
MCH: 30.3 pg (ref 26.0–34.0)
MCHC: 33.7 g/dL (ref 30.0–36.0)
MCV: 89.8 fL (ref 80.0–100.0)
Platelets: 222 10*3/uL (ref 150–400)
RBC: 4.79 MIL/uL (ref 4.22–5.81)
RDW: 12.7 % (ref 11.5–15.5)
WBC: 7.4 10*3/uL (ref 4.0–10.5)
nRBC: 0 % (ref 0.0–0.2)

## 2019-05-05 NOTE — Consult Note (Signed)
Reason for Consult:Left wrist infection Referring Physician: Princella Ion  Frank Blackwell is an 43 y.o. male.  HPI: Frank Blackwell was working on his car last Wednesday (6/10) when a started fell on his left wrist. He had a small wound but didn't think much of it. The next day it was swollen and he went to UC. They recommended ice and elevation. It seemed to do better for a few days but then got red and swelled significantly. He came to the [facility] and was admitted. He has been on abx for 3d and is mildly better but is still have purulent discharge so hand surgery was consulted to make sure there was no surgical indication. He is RHD.  History reviewed. No pertinent past medical history.  History reviewed. No pertinent surgical history.  Family History  Problem Relation Age of Onset  . Diabetes Mellitus II Neg Hx   . CAD Neg Hx     Social History:  reports that he has never smoked. He has never used smokeless tobacco. He reports current alcohol use. He reports that he does not use drugs.  Allergies: No Known Allergies  Medications: I have reviewed the patient's current medications.  Results for orders placed or performed during the hospital encounter of 05/01/19 (from the past 48 hour(s))  CBC     Status: None   Collection Time: 05/04/19  2:37 AM  Result Value Ref Range   WBC 6.7 4.0 - 10.5 K/uL   RBC 4.95 4.22 - 5.81 MIL/uL   Hemoglobin 15.0 13.0 - 17.0 g/dL   HCT 44.9 39.0 - 52.0 %   MCV 90.7 80.0 - 100.0 fL   MCH 30.3 26.0 - 34.0 pg   MCHC 33.4 30.0 - 36.0 g/dL   RDW 12.7 11.5 - 15.5 %   Platelets 172 150 - 400 K/uL   nRBC 0.0 0.0 - 0.2 %    Comment: Performed at [facility], [place] [address] [place] [address]  CBC     Status: None   Collection Time: 05/05/19  2:43 AM  Result Value Ref Range   WBC 7.4 4.0 - 10.5 K/uL   RBC 4.79 4.22 - 5.81 MIL/uL   Hemoglobin 14.5 13.0 - 17.0 g/dL   HCT 43.0 39.0 - 52.0 %   MCV 89.8 80.0 - 100.0 fL   MCH 30.3 26.0 - 34.0 pg   MCHC 33.7 30.0 -  36.0 g/dL   RDW 12.7 11.5 - 15.5 %   Platelets 222 150 - 400 K/uL   nRBC 0.0 0.0 - 0.2 %    Comment: Performed at [facility], [place] [address], [place], [place] [address]    No results found.  Review of Systems  Constitutional: Negative for chills, fever and weight loss.  HENT: Negative for ear discharge, ear pain, hearing loss and tinnitus.   Eyes: Negative for blurred vision, double vision, photophobia and pain.  Respiratory: Negative for cough, sputum production and shortness of breath.   Cardiovascular: Negative for chest pain.  Gastrointestinal: Negative for abdominal pain, nausea and vomiting.  Genitourinary: Negative for dysuria, flank pain, frequency and urgency.  Musculoskeletal: Positive for joint pain (Left wrist). Negative for back pain, falls, myalgias and neck pain.  Neurological: Negative for dizziness, tingling, sensory change, focal weakness, loss of consciousness and headaches.  Endo/Heme/Allergies: Does not bruise/bleed easily.  Psychiatric/Behavioral: Negative for depression, memory loss and substance abuse. The patient is not nervous/anxious.    Blood pressure 130/86, pulse 64, temperature  99.1 F (37.3 C), temperature source Oral, resp. rate 17, height 6' (1.829 m), weight 113.4 kg, SpO2 98 %. Physical Exam  Constitutional: He appears well-developed and well-nourished. No distress.  HENT:  Head: Normocephalic and atraumatic.  Eyes: Conjunctivae are normal. Right eye exhibits no discharge. Left eye exhibits no discharge. No scleral icterus.  Neck: Normal range of motion.  Cardiovascular: Normal rate and regular rhythm.  Respiratory: Effort normal. No respiratory distress.  Musculoskeletal:     Comments: Left shoulder, elbow, wrist, digits- Punctate wound dorsum of wrist, mild purulent discharge, surrounding erythema, mildly TTP, no instability, wrist extension impaired  Sens  Ax/R/M/U intact  Mot   Ax/ R/ PIN/ M/ AIN/ U intact  Rad 2+  Neurological: He  is alert.  Skin: Skin is warm and dry. He is not diaphoretic.  Psychiatric: He has a normal mood and affect. His behavior is normal.    Assessment/Plan: Left wrist cellulitis -- Will get US to ensure no deep abscess that needs to be drained. Otherwise would continue treatment as prescribed.    Freeman CaldronMichael J. Arthor Gorter, PA-C Orthopedic Surgery [phone] 05/05/2019, 10:31 AM

## 2019-05-05 NOTE — Progress Notes (Signed)
[facility] PROGRESS NOTE  Jovian Lembcke LGX:[id number] DOB: January 21, 1976 DOA: 05/01/2019 PCP: Patient, No Pcp Per  Assessment/Plan:   1. Cellulitis of the left hand and wrist- Continues with mildly less redness, less swelling, less pain. Greater ROM of fingers. Light pink streak along inner aspect of left arm only up to elbow today vs up to axilla. cleocin stopped 6/17. Started vanc and zosyn 6/17.  Remains afebrile,no leukocytosis. Non-toxic appearing.continue vanc and zosyn. Keep elevated. Have requested ortho consult given slow progression. Appreciate input. Will follow [place] 2. History of tobacco abuse-tobacco cessation counseling requested   Code Status: full Family Communication: patient Disposition Plan: home hopefully tomorrow   Consultants:  Silvestre Gunner ortho  Procedures:    Antibiotics:  Clindamycin 6/16-6/17  Vancomycin 6/17>>>  Zosyn 6/17  HPI/Subjective: Sitting in chair. Reports some improvement of pain. Feels like hand is less swollen.  Objective: Vitals:   05/04/19 2033 05/05/19 0428  BP: 128/78 130/86  Pulse: 62 64  Resp: 19 17  Temp: 98.1 F (36.7 C) 99.1 F (37.3 C)  SpO2: 100% 98%    Intake/Output Summary (Last 24 hours) at 05/05/2019 1216 Last data filed at 05/05/2019 0429 Gross per 24 hour  Intake 700 ml  Output -  Net 700 ml   Filed Weights   05/01/19 1755  Weight: 113.4 kg    Exam:   General:  Awake alert no acute distress  Cardiovascular: rrr no mgr no le edema  Respiratory: normal effort BS clear bilaterally no wheeze  Abdomen: non-distended non-tender +BS no guarding or rebounding  Musculoskeletal: left hand with dressing moderate amount yellowish drainage. Left hand with mild erythema and moderate swelling to dorsal hand and fingers. Light pink streak inner aspect of arm to elbow. Pain with pronation  Data Reviewed: Basic Metabolic Panel: Recent Labs  Lab 05/01/19 1752 05/02/19 0323 05/03/19 0312  NA  133* 132* 135  K 4.0 4.0 4.1  CL 96* 96* 99  CO2 26 26 26   GLUCOSE 132* 112* 109*  BUN 6 6 8   CREATININE 0.75 0.64 0.66  CALCIUM 8.7* 8.3* 8.5*   Liver Function Tests: Recent Labs  Lab 05/01/19 1752  AST 45*  ALT 59*  ALKPHOS 85  BILITOT 0.6  PROT 6.7  ALBUMIN 3.4*   No results for input(s): LIPASE, AMYLASE in the last 168 hours. No results for input(s): AMMONIA in the last 168 hours. CBC: Recent Labs  Lab 05/01/19 1752 05/02/19 0323 05/03/19 0312 05/04/19 0237 05/05/19 0243  WBC 5.8 5.3 6.0 6.7 7.4  NEUTROABS 4.0  --   --   --   --   HGB 16.6 15.0 15.0 15.0 14.5  HCT 49.6 44.2 43.5 44.9 43.0  MCV 93.9 91.1 90.8 90.7 89.8  PLT 190 164 173 172 222   Cardiac Enzymes: No results for input(s): CKTOTAL, CKMB, CKMBINDEX, TROPONINI in the last 168 hours. BNP (last 3 results) No results for input(s): BNP in the last 8760 hours.  ProBNP (last 3 results) No results for input(s): PROBNP in the last 8760 hours.  CBG: No results for input(s): GLUCAP in the last 168 hours.  Recent Results (from the past 240 hour(s))  Blood Culture (routine x 2)     Status: None (Preliminary result)   Collection Time: 05/01/19  6:00 PM   Specimen: BLOOD RIGHT ARM  Result Value Ref Range Status   Specimen Description BLOOD RIGHT ARM  Final   Special Requests   Final    BOTTLES DRAWN AEROBIC AND  ANAEROBIC Blood Culture results may not be optimal due to an excessive volume of blood received in culture bottles   Culture   Final    NO GROWTH 4 DAYS Performed at [facility], [address] [place], [place] [address]    Report Status PENDING  Incomplete  Blood Culture (routine x 2)     Status: None (Preliminary result)   Collection Time: 05/01/19  8:54 PM   Specimen: BLOOD  Result Value Ref Range Status   Specimen Description BLOOD RIGHT FOREARM  Final   Special Requests   Final    BOTTLES DRAWN AEROBIC AND ANAEROBIC Blood Culture adequate volume   Culture   Final    NO GROWTH 4  DAYS Performed at [facility], [address] [place], [place] [address]    Report Status PENDING  Incomplete  SARS Coronavirus 2     Status: None   Collection Time: 05/01/19 10:09 PM  Result Value Ref Range Status   SARS Coronavirus 2 NOT DETECTED NOT DETECTED Final    Comment: (NOTE) SARS-CoV-2 target nucleic acids are NOT DETECTED. The SARS-CoV-2 RNA is generally detectable in upper and lower respiratory specimens during the acute phase of infection.  Negative  results do not preclude SARS-CoV-2 infection, do not rule out co-infections with other pathogens, and should not be used as the sole basis for treatment or other patient management decisions.  Negative results must be combined with clinical observations, patient history, and epidemiological information. The expected result is Not Detected. Fact Sheet for Patients: http://www.biofiredefense.com/wp-content/uploads/2020/03/BIOFIRE-COVID -19-patients.pdf Fact Sheet for Healthcare Providers: http://www.biofiredefense.com/wp-content/uploads/2020/03/BIOFIRE-COVID -19-hcp.pdf This test is not yet approved or cleared by the [place] FDA and  has been authorized for detection and/or diagnosis of SARS-CoV-2 by FDA under an Emergency Use Authorization (EUA).  This EUA will remain in effec t (meaning this test can be used) for the duration of  the COVID-19 declaration under Section 564(b)(1) of the Act, 21 U.S.C. section 360bbb-3(b)(1), unless the authorization is terminated or revoked sooner. Performed at [facility], [address] [place], [place] [address]      Studies: No results found.  Scheduled Meds: Continuous Infusions: . piperacillin-tazobactam (ZOSYN)  IV 3.375 g (05/05/19 0935)  . vancomycin 1,500 mg (05/05/19 [id number])    Principal Problem:   Cellulitis of left wrist Active Problems:   Tobacco use disorder    Time spent: 45 minutes    [facility] ClinicsBLACK,Zyona Pettaway M NP [facility]  If  7PM-7AM, please contact night-coverage at [email or web address], password [email or web address] 05/05/2019, 12:16 PM  LOS: 3 days

## 2019-05-06 ENCOUNTER — Inpatient Hospital Stay (HOSPITAL_COMMUNITY): Payer: Self-pay | Admitting: Certified Registered"

## 2019-05-06 ENCOUNTER — Encounter (HOSPITAL_COMMUNITY): Payer: Self-pay | Admitting: Anesthesiology

## 2019-05-06 ENCOUNTER — Encounter (HOSPITAL_COMMUNITY)
Admission: EM | Disposition: A | Discharge status: Home / Self Care | Payer: Self-pay | Source: Home / Self Care | Attending: Internal Medicine

## 2019-05-06 DIAGNOSIS — L03114 Cellulitis of left upper limb: Secondary | ICD-10-CM

## 2019-05-06 DIAGNOSIS — F17200 Nicotine dependence, unspecified, uncomplicated: Secondary | ICD-10-CM

## 2019-05-06 HISTORY — PX: I & D EXTREMITY: SHX5045

## 2019-05-06 LAB — CULTURE, BLOOD (ROUTINE X 2)
Culture: NO GROWTH
Culture: NO GROWTH
Special Requests: ADEQUATE

## 2019-05-06 LAB — CBC
HCT: 44.3 % (ref 39.0–52.0)
Hemoglobin: 15 g/dL (ref 13.0–17.0)
MCH: 30.8 pg (ref 26.0–34.0)
MCHC: 33.9 g/dL (ref 30.0–36.0)
MCV: 91 fL (ref 80.0–100.0)
Platelets: 257 10*3/uL (ref 150–400)
RBC: 4.87 MIL/uL (ref 4.22–5.81)
RDW: 12.7 % (ref 11.5–15.5)
WBC: 8 10*3/uL (ref 4.0–10.5)
nRBC: 0 % (ref 0.0–0.2)

## 2019-05-06 LAB — MRSA PCR SCREENING: MRSA by PCR: NEGATIVE

## 2019-05-06 LAB — CREATININE, SERUM
Creatinine, Ser: 0.81 mg/dL (ref 0.61–1.24)
GFR calc Af Amer: >60 mL/min (ref >60)
GFR calc non Af Amer: >60 mL/min (ref >60)

## 2019-05-06 LAB — C-REACTIVE PROTEIN: CRP: 9.6 mg/dL — ABNORMAL HIGH (ref <1.0)

## 2019-05-06 SURGERY — IRRIGATION AND DEBRIDEMENT EXTREMITY
Anesthesia: General | Site: Wrist | Laterality: Left

## 2019-05-06 MED ORDER — 0.9 % SODIUM CHLORIDE (POUR BTL) OPTIME
TOPICAL | Status: DC | PRN
  Administered 2019-05-06: 1000 mL

## 2019-05-06 MED ORDER — LIDOCAINE 2% (20 MG/ML) 5 ML SYRINGE
INTRAMUSCULAR | Status: AC
  Filled 2019-05-06: qty 5

## 2019-05-06 MED ORDER — SODIUM CHLORIDE 0.9 % IV SOLN: INTRAVENOUS | Status: DC | PRN

## 2019-05-06 MED ORDER — ONDANSETRON HCL 4 MG/2ML IJ SOLN
INTRAMUSCULAR | Status: AC
  Filled 2019-05-06: qty 2

## 2019-05-06 MED ORDER — PROPOFOL 10 MG/ML IV BOLUS
INTRAVENOUS | Status: AC
  Filled 2019-05-06: qty 20

## 2019-05-06 MED ORDER — FENTANYL CITRATE (PF) 100 MCG/2ML IJ SOLN
25.0000 ug | INTRAMUSCULAR | Status: DC | PRN
  Administered 2019-05-06 (×2): 50 ug via INTRAVENOUS

## 2019-05-06 MED ORDER — LACTATED RINGERS IV SOLN
INTRAVENOUS | Status: DC | PRN
  Administered 2019-05-06 (×2): via INTRAVENOUS

## 2019-05-06 MED ORDER — FENTANYL CITRATE (PF) 250 MCG/5ML IJ SOLN
INTRAMUSCULAR | Status: AC
  Filled 2019-05-06: qty 5

## 2019-05-06 MED ORDER — FENTANYL CITRATE (PF) 100 MCG/2ML IJ SOLN
INTRAMUSCULAR | Status: AC
  Administered 2019-05-06: 50 ug via INTRAVENOUS
  Filled 2019-05-06: qty 2

## 2019-05-06 MED ORDER — BUPIVACAINE HCL (PF) 0.5 % IJ SOLN
INTRAMUSCULAR | Status: DC | PRN
  Administered 2019-05-06: 20 mL

## 2019-05-06 MED ORDER — ONDANSETRON HCL 4 MG/2ML IJ SOLN: 4.0000 mg | Freq: Four times a day (QID) | INTRAMUSCULAR | Status: DC | PRN

## 2019-05-06 MED ORDER — MIDAZOLAM HCL 2 MG/2ML IJ SOLN
INTRAMUSCULAR | Status: AC
  Filled 2019-05-06: qty 2

## 2019-05-06 MED ORDER — BUPIVACAINE HCL (PF) 0.5 % IJ SOLN
INTRAMUSCULAR | Status: AC
  Filled 2019-05-06: qty 30

## 2019-05-06 MED ORDER — OXYCODONE HCL 5 MG/5ML PO SOLN: 5.0000 mg | Freq: Once | ORAL | Status: DC | PRN

## 2019-05-06 MED ORDER — LACTATED RINGERS IV SOLN
INTRAVENOUS | Status: DC
  Administered 2019-05-06: 15:00:00 via INTRAVENOUS

## 2019-05-06 MED ORDER — LIDOCAINE HCL (CARDIAC) PF 100 MG/5ML IV SOSY
PREFILLED_SYRINGE | INTRAVENOUS | Status: DC | PRN
  Administered 2019-05-06: 60 mg via INTRATRACHEAL

## 2019-05-06 MED ORDER — FENTANYL CITRATE (PF) 250 MCG/5ML IJ SOLN
INTRAMUSCULAR | Status: DC | PRN
  Administered 2019-05-06: 50 ug via INTRAVENOUS
  Administered 2019-05-06: 100 ug via INTRAVENOUS
  Administered 2019-05-06 (×2): 50 ug via INTRAVENOUS

## 2019-05-06 MED ORDER — MIDAZOLAM HCL 5 MG/5ML IJ SOLN
INTRAMUSCULAR | Status: DC | PRN
  Administered 2019-05-06: 2 mg via INTRAVENOUS

## 2019-05-06 MED ORDER — DEXAMETHASONE SODIUM PHOSPHATE 10 MG/ML IJ SOLN
INTRAMUSCULAR | Status: AC
  Filled 2019-05-06: qty 1

## 2019-05-06 MED ORDER — OXYCODONE HCL 5 MG PO TABS: 5.0000 mg | ORAL_TABLET | Freq: Once | ORAL | Status: DC | PRN

## 2019-05-06 MED ORDER — PROPOFOL 10 MG/ML IV BOLUS
INTRAVENOUS | Status: DC | PRN
  Administered 2019-05-06: 200 mg via INTRAVENOUS

## 2019-05-06 SURGICAL SUPPLY — 20 items
BANDAGE ACE 3X5.8 VEL STRL LF (GAUZE/BANDAGES/DRESSINGS) ×3 IMPLANT
BNDG GAUZE ELAST 4 BULKY (GAUZE/BANDAGES/DRESSINGS) ×3 IMPLANT
CORDS BIPOLAR (ELECTRODE) ×3 IMPLANT
GAUZE SPONGE 4X4 12PLY STRL (GAUZE/BANDAGES/DRESSINGS) ×3 IMPLANT
GAUZE XEROFORM 1X8 LF (GAUZE/BANDAGES/DRESSINGS) ×3 IMPLANT
GLOVE BIOGEL M 8.0 STRL (GLOVE) ×3 IMPLANT
GOWN STRL REUS W/ TWL LRG LVL3 (GOWN DISPOSABLE) ×2 IMPLANT
GOWN STRL REUS W/TWL LRG LVL3 (GOWN DISPOSABLE) ×4
KIT BASIN OR (CUSTOM PROCEDURE TRAY) ×3 IMPLANT
KIT TURNOVER KIT B (KITS) ×3 IMPLANT
NEEDLE HYPO 25GX1X1/2 BEV (NEEDLE) ×3 IMPLANT
NS IRRIG 1000ML POUR BTL (IV SOLUTION) ×3 IMPLANT
PACK ORTHO EXTREMITY (CUSTOM PROCEDURE TRAY) ×3 IMPLANT
PAD ARMBOARD 7.5X6 YLW CONV (MISCELLANEOUS) ×3 IMPLANT
SOAP 2 % CHG 4 OZ (WOUND CARE) ×3 IMPLANT
SYR CONTROL 10ML LL (SYRINGE) ×3 IMPLANT
TOWEL OR 17X26 10 PK STRL BLUE (TOWEL DISPOSABLE) ×3 IMPLANT
TUBE CONNECTING 12'X1/4 (SUCTIONS) ×1
TUBE CONNECTING 12X1/4 (SUCTIONS) ×2 IMPLANT
YANKAUER SUCT BULB TIP NO VENT (SUCTIONS) ×3 IMPLANT

## 2019-05-06 NOTE — Progress Notes (Signed)
Patient returned to 6N15 from PACU, A&Ox4, VSS, RFA IV intact and infusing.  Noted to have compression wrap present on right hand, wrist, and forearm.  Rates pain 5/10.  Will continue to monitor.

## 2019-05-06 NOTE — Anesthesia Preprocedure Evaluation (Addendum)
Anesthesia Evaluation  Patient identified by MRN, date of birth, ID band Patient awake    Reviewed: Allergy & Precautions, H&P , NPO status , Patient's Chart, lab work & pertinent test results  Airway Mallampati: II   Neck ROM: full    Dental  (+) Dental Advisory Given   Pulmonary neg pulmonary ROS,    breath sounds clear to auscultation       Cardiovascular negative cardio ROS   Rhythm:regular Rate:Normal     Neuro/Psych    GI/Hepatic   Endo/Other  obese  Renal/GU      Musculoskeletal   Abdominal   Peds  Hematology   Anesthesia Other Findings   Reproductive/Obstetrics                            Anesthesia Physical Anesthesia Plan  ASA: II  Anesthesia Plan: General   Post-op Pain Management:    Induction: Intravenous  PONV Risk Score and Plan: 2 and Ondansetron, Dexamethasone, Midazolam and Treatment may vary due to age or medical condition  Airway Management Planned: LMA  Additional Equipment:   Intra-op Plan:   Post-operative Plan:   Informed Consent: I have reviewed the patients History and Physical, chart, labs and discussed the procedure including the risks, benefits and alternatives for the proposed anesthesia with the patient or authorized representative who has indicated his/her understanding and acceptance.       Plan Discussed with: CRNA, Anesthesiologist and Surgeon  Anesthesia Plan Comments:         Anesthesia Quick Evaluation

## 2019-05-06 NOTE — Anesthesia Procedure Notes (Signed)
Procedure Name: LMA Insertion Date/Time: 05/06/2019 3:13 PM Performed by: Clovis Cao, CRNA Pre-anesthesia Checklist: Patient identified, Emergency Drugs available, Suction available, Patient being monitored and Timeout performed Patient Re-evaluated:Patient Re-evaluated prior to induction Oxygen Delivery Method: Circle system utilized Preoxygenation: Pre-oxygenation with 100% oxygen Induction Type: IV induction Ventilation: Mask ventilation without difficulty LMA: LMA inserted LMA Size: 4.0 Number of attempts: 1 Placement Confirmation: positive ETCO2 and breath sounds checked- equal and bilateral Tube secured with: Tape Dental Injury: Teeth and Oropharynx as per pre-operative assessment

## 2019-05-06 NOTE — Progress Notes (Signed)
[facility] PROGRESS NOTE  Christpher Stogsdill WRU:[id number] DOB: 1976-08-30 DOA: 05/01/2019 PCP: Patient, No Pcp Per  Assessment/Plan:   Cellulitis of the left hand and wrist- Continues with mildly less redness, less swelling, less pain. Greater ROM of fingers.  - clindomycin stopped 6/17. Started vanc and zosyn 6/17.  -afebrile,no leukocytosis.  -ortho consult -U/S ? Septic arthritis -to O/R today per ortho  History of tobacco abuse-tobacco cessation counseling requested   Code Status: full Family Communication: patient Disposition Plan: home hopefully tomorrow   Consultants:  Silvestre Gunner ortho  Procedures:    Antibiotics:  Clindamycin 6/16-6/17  Vancomycin 6/17>>>  Zosyn 6/17  HPI/Subjective: Up walking in room  Objective: Vitals:   05/05/19 2053 05/06/19 0550  BP: 132/81 131/82  Pulse: 70 61  Resp: 19 19  Temp: 98.4 F (36.9 C) 98.6 F (37 C)  SpO2: 97% 97%    Intake/Output Summary (Last 24 hours) at 05/06/2019 1305 Last data filed at 05/06/2019 0604 Gross per 24 hour  Intake 1645.71 ml  Output 1 ml  Net 1644.71 ml   Filed Weights   05/01/19 1755  Weight: 113.4 kg    Exam:   General:  Up walking in room, afebrile  Cardiovascular: rrr  Respiratory: no increased work of breathing Musculoskeletal: small dressing on left hand, still with some swelling, can move fingers but it is painful  Data Reviewed: Basic Metabolic Panel: Recent Labs  Lab 05/01/19 1752 05/02/19 0323 05/03/19 0312 05/06/19 0408  NA 133* 132* 135  --   K 4.0 4.0 4.1  --   CL 96* 96* 99  --   CO2 26 26 26   --   GLUCOSE 132* 112* 109*  --   BUN 6 6 8   --   CREATININE 0.75 0.64 0.66 0.81  CALCIUM 8.7* 8.3* 8.5*  --    Liver Function Tests: Recent Labs  Lab 05/01/19 1752  AST 45*  ALT 59*  ALKPHOS 85  BILITOT 0.6  PROT 6.7  ALBUMIN 3.4*   No results for input(s): LIPASE, AMYLASE in the last 168 hours. No results for input(s): AMMONIA in the  last 168 hours. CBC: Recent Labs  Lab 05/01/19 1752 05/02/19 0323 05/03/19 0312 05/04/19 0237 05/05/19 0243 05/06/19 0408  WBC 5.8 5.3 6.0 6.7 7.4 8.0  NEUTROABS 4.0  --   --   --   --   --   HGB 16.6 15.0 15.0 15.0 14.5 15.0  HCT 49.6 44.2 43.5 44.9 43.0 44.3  MCV 93.9 91.1 90.8 90.7 89.8 91.0  PLT 190 164 173 172 222 257   Cardiac Enzymes: No results for input(s): CKTOTAL, CKMB, CKMBINDEX, TROPONINI in the last 168 hours. BNP (last 3 results) No results for input(s): BNP in the last 8760 hours.  ProBNP (last 3 results) No results for input(s): PROBNP in the last 8760 hours.  CBG: No results for input(s): GLUCAP in the last 168 hours.  Recent Results (from the past 240 hour(s))  Blood Culture (routine x 2)     Status: None (Preliminary result)   Collection Time: 05/01/19  6:00 PM   Specimen: BLOOD RIGHT ARM  Result Value Ref Range Status   Specimen Description BLOOD RIGHT ARM  Final   Special Requests   Final    BOTTLES DRAWN AEROBIC AND ANAEROBIC Blood Culture results may not be optimal due to an excessive volume of blood received in culture bottles   Culture   Final    NO GROWTH 4 DAYS Performed at  [facility], [address] [place], [place] [address]    Report Status PENDING  Incomplete  Blood Culture (routine x 2)     Status: None (Preliminary result)   Collection Time: 05/01/19  8:54 PM   Specimen: BLOOD  Result Value Ref Range Status   Specimen Description BLOOD RIGHT FOREARM  Final   Special Requests   Final    BOTTLES DRAWN AEROBIC AND ANAEROBIC Blood Culture adequate volume   Culture   Final    NO GROWTH 4 DAYS Performed at [facility], [address] [place], [place] [address]    Report Status PENDING  Incomplete  SARS Coronavirus 2     Status: None   Collection Time: 05/01/19 10:09 PM  Result Value Ref Range Status   SARS Coronavirus 2 NOT DETECTED NOT DETECTED Final    Comment: (NOTE) SARS-CoV-2 target nucleic acids are NOT  DETECTED. The SARS-CoV-2 RNA is generally detectable in upper and lower respiratory specimens during the acute phase of infection.  Negative  results do not preclude SARS-CoV-2 infection, do not rule out co-infections with other pathogens, and should not be used as the sole basis for treatment or other patient management decisions.  Negative results must be combined with clinical observations, patient history, and epidemiological information. The expected result is Not Detected. Fact Sheet for Patients: http://www.biofiredefense.com/wp-content/uploads/2020/03/BIOFIRE-COVID -19-patients.pdf Fact Sheet for Healthcare Providers: http://www.biofiredefense.com/wp-content/uploads/2020/03/BIOFIRE-COVID -19-hcp.pdf This test is not yet approved or cleared by the [place] FDA and  has been authorized for detection and/or diagnosis of SARS-CoV-2 by FDA under an Emergency Use Authorization (EUA).  This EUA will remain in effec t (meaning this test can be used) for the duration of  the COVID-19 declaration under Section 564(b)(1) of the Act, 21 U.S.C. section 360bbb-3(b)(1), unless the authorization is terminated or revoked sooner. Performed at [facility], [address] [place], [place] [address]      Studies: [place] Lt Upper Extrem Ltd Soft Tissue Non Vascular  Result Date: 05/05/2019 CLINICAL DATA:  Pain and swelling of the left wrist for 1 week. EXAM: ULTRASOUND left UPPER EXTREMITY LIMITED TECHNIQUE: Ultrasound examination of the upper extremity soft tissues was performed in the area of clinical concern. COMPARISON:  CT left wrist 05/01/2019 FINDINGS: Diffuse and fairly marked subcutaneous soft tissue swelling/edema involving the dorsum of the wrist similar to the recent CT scan. No focal fluid collection to suggest a drainable soft tissue abscess. A wrist joint effusion is noted. If there is any clinical concern for septic arthritis MRI wrist without and with contrast is  recommended for further evaluation. IMPRESSION: 1. Diffuse and fairly marked subcutaneous soft tissue swelling/edema involving the dorsum of the wrist and hand without focal fluid collection to suggest a drainable abscess. 2. Wrist joint effusion suspected. MRI may be helpful for further evaluation, particularly if septic arthritis is a concern. Electronically Signed   By: Rudie MeyerP.  Gallerani M.D.   On: 05/05/2019 13:33    Scheduled Meds: Continuous Infusions: . piperacillin-tazobactam (ZOSYN)  IV 3.375 g (05/06/19 0815)  . vancomycin 1,500 mg (05/06/19 0817)    Principal Problem:   Cellulitis of left wrist Active Problems:   Tobacco use disorder    Time spent: 25 minutes    Joseph ArtJessica U Vann DO [facility]  If 7PM-7AM, please contact night-coverage at [email or web address], password [email or web address] 05/06/2019, 1:05 PM  LOS: 4 days

## 2019-05-06 NOTE — Progress Notes (Signed)
Pharmacy Antibiotic Note  Frank Blackwell is a 43 y.o. male admitted on 05/01/2019 with cellulitis of the left hand and wrist. U/S ? septic arthritis. Pharmacy has been consulted for Vancomycin and Zosyn dosing.  Plan: Continue Vancomycin 1500mg  IV q12h Check Vancomycin peak/trough levels tomorrow Continue Zosyn 3.375g IV q8h (4-hour infusion) Monitor renal function, cultures, clinical course  Height: 6' (182.9 cm) Weight: 250 lb (113.4 kg) IBW/kg (Calculated) : 77.6  Temp (24hrs), Avg:98.3 F (36.8 C), Min:98 F (36.7 C), Max:98.6 F (37 C)  Recent Labs  Lab 05/01/19 1752 05/02/19 0323 05/03/19 0312 05/04/19 0237 05/05/19 0243 05/06/19 0408  WBC 5.8 5.3 6.0 6.7 7.4 8.0  CREATININE 0.75 0.64 0.66  --   --  0.81  LATICACIDVEN 1.0  --   --   --   --   --     Estimated Creatinine Clearance: 154.4 mL/min (by C-G formula based on SCr of 0.81 mg/dL).    No Known Allergies   Thank you for allowing pharmacy to be a part of this patient's care.   Lindell Spar, PharmD, BCPS Clinical Pharmacist  05/06/2019 1:27 PM

## 2019-05-06 NOTE — Progress Notes (Signed)
I have seen and examined this patient this am; agree with PA Jeffery's consult. Pt has drainage of left wrist area dorsally, with surrounding erythema and swelling. Discussed plan with patient.  Will I&D.

## 2019-05-06 NOTE — Transfer of Care (Signed)
Immediate Anesthesia Transfer of Care Note  Patient: Frank Blackwell  Procedure(s) Performed: IRRIGATION AND DEBRIDEMENT EXTREMITY (Left Wrist)  Patient Location: PACU  Anesthesia Type:General  Level of Consciousness: awake  Airway & Oxygen Therapy: Patient Spontanous Breathing  Post-op Assessment: Report given to RN and Post -op Vital signs reviewed and stable  Post vital signs: Reviewed and stable  Last Vitals:  Vitals Value Taken Time  BP 136/86 05/06/19 1539  Temp    Pulse 74 05/06/19 1541  Resp 15 05/06/19 1541  SpO2 98 % 05/06/19 1541  Vitals shown include unvalidated device data.  Last Pain:  Vitals:   05/06/19 0815  TempSrc:   PainSc: 0-No pain      Patients Stated Pain Goal: 3 (87/86/76 7209)  Complications: No apparent anesthesia complications

## 2019-05-07 LAB — CREATININE, SERUM
Creatinine, Ser: 0.82 mg/dL (ref 0.61–1.24)
GFR calc Af Amer: >60 mL/min (ref >60)
GFR calc non Af Amer: >60 mL/min (ref >60)

## 2019-05-07 LAB — VANCOMYCIN, PEAK
Vancomycin Pk: 35 ug/mL (ref 30–40)
Vancomycin Pk: 28 ug/mL — ABNORMAL LOW (ref 30–40)

## 2019-05-07 MED ORDER — ACETAMINOPHEN 325 MG PO TABS: 650.0000 mg | ORAL_TABLET | Freq: Four times a day (QID) | ORAL | Status: AC | PRN

## 2019-05-07 MED ORDER — DOXYCYCLINE HYCLATE 100 MG PO TABS
100.0000 mg | ORAL_TABLET | Freq: Two times a day (BID) | ORAL | Status: DC
  Administered 2019-05-07: 100 mg via ORAL
  Filled 2019-05-07: qty 1

## 2019-05-07 MED ORDER — DOXYCYCLINE HYCLATE 100 MG PO TABS: 100.0000 mg | ORAL_TABLET | Freq: Two times a day (BID) | ORAL | 0 refills | Status: AC

## 2019-05-07 MED ORDER — IBUPROFEN 400 MG PO TABS: 400.0000 mg | ORAL_TABLET | Freq: Four times a day (QID) | ORAL | 0 refills | Status: AC | PRN

## 2019-05-07 NOTE — Discharge Summary (Signed)
Physician Discharge Summary  Frank NonesBrian Blackwell UEA:[id number] DOB: October 08, 1976 DOA: 05/01/2019  PCP: Gwyneth Sproutiller, Eli, MD  Admit date: 05/01/2019 Discharge date: 05/07/2019  Admitted From: home Discharge disposition: home   Recommendations for Outpatient Follow-Up:   1. Wound care 2. Doxy 3. LFTs 1 week  Discharge Diagnosis:   Principal Problem:   Cellulitis of left wrist Active Problems:   Tobacco use disorder    Discharge Condition: Improved.  Diet recommendation: Regular.  Wound care: irrigate wound tid  Code status: Full.   History of Present Illness:   Frank Blackwell is a 43 y.o. male with no significant past medical history presents to the ER with worsening swelling and drainage from the left wrist and hand.  Patient states he had a injury while working when a weight dropped onto his hand and also punctured his skin.  Had gone to urgent care following day and x-rays as per the patient did not show anything acute.  Following which patient's symptoms worsen with patient started noticing increasing drainage and decreased mobility of his wrist.   Hospital Course by Problem:   Abscess/Cellulitis of the left hand and wrist -clindomycinstopped 6/17. Started vanc and zosyn 6/17.  -afebrile,no leukocytosis.  -ortho consult appreciated -s/p I/D by Dr. Izora Ribasoley -change to PO abx  History of tobacco abuse-tobacco cessation counseling requested    Medical Consultants:    Ortho/hand  Discharge Exam:   Vitals:   05/07/19 0449 05/07/19 1130  BP: 123/79 (!) 130/96  Pulse: (!) 54 61  Resp: 18 17  Temp: 98.2 F (36.8 C) 98.1 F (36.7 C)  SpO2: 98% 99%   Vitals:   05/06/19 1652 05/06/19 2101 05/07/19 0449 05/07/19 1130  BP:  132/85 123/79 (!) 130/96  Pulse:  65 (!) 54 61  Resp:  18 18 17   Temp:  99.1 F (37.3 C) 98.2 F (36.8 C) 98.1 F (36.7 C)  TempSrc:  Oral Oral Oral  SpO2: 95% 98% 98% 99%  Weight:      Height:        General exam: Appears calm and  comfortable.    The results of significant diagnostics from this hospitalization (including imaging, microbiology, ancillary and laboratory) are listed below for reference.     Procedures and Diagnostic Studies:   Dg Forearm Left  Result Date: 05/01/2019 CLINICAL DATA:  Swelling arm. Rule out septic joint. Injury 5 days ago EXAM: LEFT FOREARM - 2 VIEW COMPARISON:  None. FINDINGS: There is no evidence of fracture or other focal bone lesions. Soft tissues are unremarkable. IMPRESSION: Negative. Electronically Signed   By: Marlan Palauharles  Clark M.D.   On: 05/01/2019 19:00   Dg Wrist Complete Left  Result Date: 05/01/2019 CLINICAL DATA:  Left wrist pain in distal forearm pain. EXAM: LEFT WRIST - COMPLETE 3+ VIEW COMPARISON:  None. FINDINGS: There is no evidence of fracture or dislocation. There is no evidence of arthropathy or other focal bone abnormality. Soft tissues are unremarkable. IMPRESSION: Negative. Electronically Signed   By: Ted Mcalpineobrinka  Dimitrova M.D.   On: 05/01/2019 18:51   Ct Wrist Left W Contrast  Result Date: 05/01/2019 CLINICAL DATA:  Hand swelling, osteomyelitis suspected EXAM: CT OF THE UPPER LEFT EXTREMITY WITH CONTRAST TECHNIQUE: Multidetector CT imaging of the upper left extremity was performed according to the standard protocol following intravenous contrast administration. COMPARISON:  None. CONTRAST:  100mL OMNIPAQUE IOHEXOL 300 MG/ML  SOLN FINDINGS: Soft tissue swelling noted along the dorsum of the hand and wrist. Stranding within the  subcutaneous soft tissues. No bone destruction to suggest osteomyelitis. No focal drainable fluid collection. No fracture, subluxation or dislocation. IMPRESSION: No acute bony abnormality.  No changes of osteomyelitis. Diffuse soft tissue swelling along the dorsum of the hand and wrist without drainable focal fluid collection. Electronically Signed   By: Charlett NoseKevin  Dover M.D.   On: 05/01/2019 22:53     Labs:   Basic Metabolic Panel: Recent Labs  Lab  05/01/19 1752 05/02/19 0323 05/03/19 0312 05/06/19 0408 05/07/19 0302  NA 133* 132* 135  --   --   K 4.0 4.0 4.1  --   --   CL 96* 96* 99  --   --   CO2 26 26 26   --   --   GLUCOSE 132* 112* 109*  --   --   BUN 6 6 8   --   --   CREATININE 0.75 0.64 0.66 0.81 0.82  CALCIUM 8.7* 8.3* 8.5*  --   --    GFR Estimated Creatinine Clearance: 152.5 mL/min (by C-G formula based on SCr of 0.82 mg/dL). Liver Function Tests: Recent Labs  Lab 05/01/19 1752  AST 45*  ALT 59*  ALKPHOS 85  BILITOT 0.6  PROT 6.7  ALBUMIN 3.4*   No results for input(s): LIPASE, AMYLASE in the last 168 hours. No results for input(s): AMMONIA in the last 168 hours. Coagulation profile No results for input(s): INR, PROTIME in the last 168 hours.  CBC: Recent Labs  Lab 05/01/19 1752 05/02/19 0323 05/03/19 0312 05/04/19 0237 05/05/19 0243 05/06/19 0408  WBC 5.8 5.3 6.0 6.7 7.4 8.0  NEUTROABS 4.0  --   --   --   --   --   HGB 16.6 15.0 15.0 15.0 14.5 15.0  HCT 49.6 44.2 43.5 44.9 43.0 44.3  MCV 93.9 91.1 90.8 90.7 89.8 91.0  PLT 190 164 173 172 222 257   Cardiac Enzymes: No results for input(s): CKTOTAL, CKMB, CKMBINDEX, TROPONINI in the last 168 hours. BNP: Invalid input(s): POCBNP CBG: No results for input(s): GLUCAP in the last 168 hours. D-Dimer No results for input(s): DDIMER in the last 72 hours. Hgb A1c No results for input(s): HGBA1C in the last 72 hours. Lipid Profile No results for input(s): CHOL, HDL, LDLCALC, TRIG, CHOLHDL, LDLDIRECT in the last 72 hours. Thyroid function studies No results for input(s): TSH, T4TOTAL, T3FREE, THYROIDAB in the last 72 hours.  Invalid input(s): FREET3 Anemia work up No results for input(s): VITAMINB12, FOLATE, FERRITIN, TIBC, IRON, RETICCTPCT in the last 72 hours. Microbiology Recent Results (from the past 240 hour(s))  Blood Culture (routine x 2)     Status: None   Collection Time: 05/01/19  6:00 PM   Specimen: BLOOD RIGHT ARM  Result Value  Ref Range Status   Specimen Description BLOOD RIGHT ARM  Final   Special Requests   Final    BOTTLES DRAWN AEROBIC AND ANAEROBIC Blood Culture results may not be optimal due to an excessive volume of blood received in culture bottles   Culture   Final    NO GROWTH 5 DAYS Performed at [facility], [address] [place], [place] [address]    Report Status 05/06/2019 FINAL  Final  Blood Culture (routine x 2)     Status: None   Collection Time: 05/01/19  8:54 PM   Specimen: BLOOD  Result Value Ref Range Status   Specimen Description BLOOD RIGHT FOREARM  Final   Special Requests   Final  BOTTLES DRAWN AEROBIC AND ANAEROBIC Blood Culture adequate volume   Culture   Final    NO GROWTH 5 DAYS Performed at [facility], [place] [address], [place], [place] [address]    Report Status 05/06/2019 FINAL  Final  SARS Coronavirus 2     Status: None   Collection Time: 05/01/19 10:09 PM  Result Value Ref Range Status   SARS Coronavirus 2 NOT DETECTED NOT DETECTED Final    Comment: (NOTE) SARS-CoV-2 target nucleic acids are NOT DETECTED. The SARS-CoV-2 RNA is generally detectable in upper and lower respiratory specimens during the acute phase of infection.  Negative  results do not preclude SARS-CoV-2 infection, do not rule out co-infections with other pathogens, and should not be used as the sole basis for treatment or other patient management decisions.  Negative results must be combined with clinical observations, patient history, and epidemiological information. The expected result is Not Detected. Fact Sheet for Patients: http://www.biofiredefense.com/wp-content/uploads/2020/03/BIOFIRE-COVID -19-patients.pdf Fact Sheet for Healthcare Providers: http://www.biofiredefense.com/wp-content/uploads/2020/03/BIOFIRE-COVID -19-hcp.pdf This test is not yet approved or cleared by the [place] and  has been authorized for detection and/or diagnosis of SARS-CoV-2 by FDA under  an Emergency Use Authorization (EUA).  This EUA will remain in effec t (meaning this test can be used) for the duration of  the COVID-19 declaration under Section 564(b)(1) of the Act, 21 U.S.C. section 360bbb-3(b)(1), unless the authorization is terminated or revoked sooner. Performed at [facility], [place] [address], [place], [place] [address]   MRSA PCR Screening     Status: None   Collection Time: 05/06/19 11:46 AM   Specimen: Nasal Mucosa; Nasopharyngeal  Result Value Ref Range Status   MRSA by PCR NEGATIVE NEGATIVE Final    Comment:        The GeneXpert MRSA Assay (FDA approved for NASAL specimens only), is one component of a comprehensive MRSA colonization surveillance program. It is not intended to diagnose MRSA infection nor to guide or monitor treatment for MRSA infections. Performed at [facility], [place] [address] [place], [place] [address]      Discharge Instructions:   Discharge Instructions    Diet general   Complete by: As directed    Increase activity slowly   Complete by: As directed      Allergies as of 05/07/2019   No Known Allergies     Medication List    TAKE these medications   acetaminophen 325 MG tablet Commonly known as: TYLENOL Take 2 tablets (650 mg total) by mouth every 6 (six) hours as needed for mild pain (or Fever >/= 101).   doxycycline 100 MG tablet Commonly known as: VIBRA-TABS Take 1 tablet (100 mg total) by mouth every 12 (twelve) hours. Notes to patient: 05/07/2019-evening dose   ibuprofen 400 MG tablet Commonly known as: ADVIL Take 1 tablet (400 mg total) by mouth every 6 (six) hours as needed for fever or moderate pain.      Follow-up [place] Follow up.   Why: DR Rupert Stacks  Contact information: [address], [place], [place] [address]  Phone [phone]       Lenon Curt, [place], MD Follow up in 1 week(s).   Specialty: General Surgery Contact information: [place]  [place] [address] [phone]            Time coordinating discharge: 25 min  Signed:  Geradine Girt DO  [facility]  05/07/2019, 2:21 PM

## 2019-05-07 NOTE — Op Note (Signed)
Frank Blackwell, Frank Blackwell MEDICAL RECORD [id number] ACCOUNT [id number] DATE OF BIRTH:01-30-1976 FACILITY: [facility] LOCATION: [facility] PHYSICIAN:Mirka Barbone C. Shuntay Everetts, MD  OPERATIVE REPORT  DATE OF PROCEDURE:  05/06/2019  PREOPERATIVE DIAGNOSIS:  Cellulitis, possible abscess of the left hand.  POSTOPERATIVE DIAGNOSIS:  Cellulitis, possible abscess of the left hand.  PROCEDURE:  Incision and drainage of the left hand including the extensor tendon sheath.  ANESTHESIA:  General.  COMPLICATIONS:  No acute complications.  SPECIMENS:  No specimens.  INDICATIONS:  The patient is a 43 year old gentleman who had something fall on his hand several days ago.  He presented to the emergency room with draining from this wound.  He was placed on IV antibiotics.  He has not really gotten any better.  On  evaluation, he had some infectious-type fluid still draining from the wound.  I felt it necessary for a formal incision and drainage.  Risks, benefits and alternatives of surgery were discussed with him.  He agreed with this course of action.  Consent  was obtained.  DESCRIPTION OF PROCEDURE:  The patient was taken to the operating room and placed supine on the operating room table.  Anesthesia was administered without difficulty after timeout was performed.  The patient was already on preoperative antibiotics, and  therefore none were given.  The left upper extremity was prepped and draped in normal sterile fashion.  An incision overlying the puncture wound was made both proximally and distally for about 4 cm.  There was some turbid-type fluid that immediately was  encountered.  There was no gross pus.  With a hemostat, the soft tissues were dissected, and there appeared to be a larger fluid collection just on top of the extensor retinaculum in close proximity to the extensor tendon sheath.  This was opened.  Then  the wound was thoroughly irrigated with IrriSept irrigation solution as well as saline.  The joint was  palpated, and there was no bulging of the wrist joint itself; therefore, intraarticular spread of infection was felt unlikely.  After thorough  irrigation, hemostasis was controlled with direct pressure and bipolar.  One suture was placed in the mid wound just for approximation.  Xeroform gauze was placed in the wound to allow the wound to stay open and drained.  The wound was dressed with 4 x  4's, Kerlix and an Ace wrap.  Marcaine was used around the incision and deep in the wound for postoperative pain control.  The patient tolerated the procedure well and was taken to the recovery room in stable condition.  LN/NUANCE  D:05/06/2019 T:05/07/2019 JOB:[id number]

## 2019-05-07 NOTE — Discharge Instructions (Signed)
Irrigate wound 3 x daily with sterile saline, then cover, move fingers

## 2019-05-07 NOTE — Progress Notes (Signed)
Roosvelt Harps to be D/C'd  per MD order. Discussed with the patient and all questions fully answered.  VSS, Skin clean, dry and intact without evidence of skin break down, no evidence of skin tears noted.  IV catheter discontinued intact. Site without signs and symptoms of complications. Dressing and pressure applied.  An After Visit Summary was printed and given to the patient. Patient received prescription.  D/c education completed with patient/family including follow up instructions, medication list, d/c activities limitations if indicated, with other d/c instructions as indicated by MD - patient able to verbalize understanding, all questions fully answered.   Patient instructed to return to ED, call 911, or call MD for any changes in condition.   Patient to be D/C home via private auto.

## 2019-05-07 NOTE — Progress Notes (Signed)
S:s/p I&D hand - hand fffle better  O:Blood pressure 123/79, pulse (!) 54, temperature 98.2 F (36.8 C), temperature source Oral, resp. rate 18, height 6' (1.829 m), weight 113.4 kg, SpO2 98 %.  Dressing changed, packing removed, less erythema, less overall swelling, fingers still swollen and some stiff, no purulent drainage  A:Abscess/cellulitis, s/p I&D   P: ok with d/c on oral abx and wound care irrigate wound tid

## 2019-05-08 ENCOUNTER — Encounter (HOSPITAL_COMMUNITY): Payer: Self-pay | Admitting: General Surgery

## 2019-05-08 NOTE — Anesthesia Postprocedure Evaluation (Signed)
Anesthesia Post Note  Patient: Frank Blackwell  Procedure(s) Performed: IRRIGATION AND DEBRIDEMENT EXTREMITY (Left Wrist)     Patient location during evaluation: PACU Anesthesia Type: General Level of consciousness: awake and alert Pain management: pain level controlled Vital Signs Assessment: post-procedure vital signs reviewed and stable Respiratory status: spontaneous breathing, nonlabored ventilation, respiratory function stable and patient connected to nasal cannula oxygen Cardiovascular status: blood pressure returned to baseline and stable Postop Assessment: no apparent nausea or vomiting Anesthetic complications: no    Last Vitals:  Vitals:   05/07/19 0449 05/07/19 1130  BP: 123/79 (!) 130/96  Pulse: (!) 54 61  Resp: 18 17  Temp: 36.8 C 36.7 C  SpO2: 98% 99%    Last Pain:  Vitals:   05/07/19 1130  TempSrc: Oral  PainSc:                  [place] S

## 2021-01-14 IMAGING — CT CT OF THE LEFT WRIST WITH CONTRAST
3 series · 10 of 34 positions shown, 12 images · IV contrast (agent unspecified)
Comparison: None.

CONTRAST:  100mL OMNIPAQUE IOHEXOL 300 MG/ML  SOLN

CLINICAL DATA: Hand swelling, osteomyelitis suspected

EXAM:
CT OF THE UPPER LEFT EXTREMITY WITH CONTRAST
TECHNIQUE: Multidetector CT imaging of the upper left extremity was performed
according to the standard protocol following intravenous contrast
administration.

[Series 4: extremity soft tissue · axial · 0.36mm/px · z∈[-86,-6]mm · 2 of 87 slices shown, 3 images]
[im 27/87  soft-tissue]
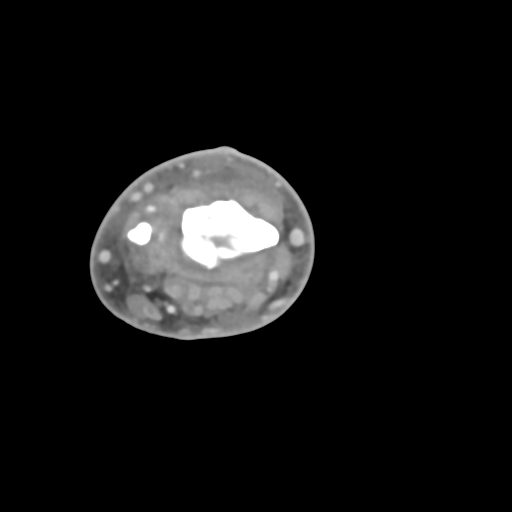
[im 27/87  bone]
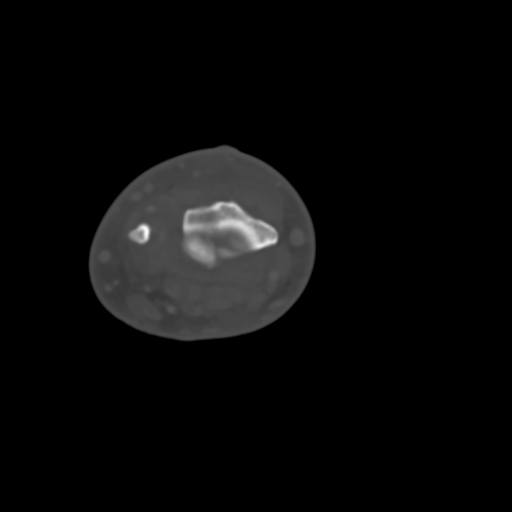
[im 67/87  bone]
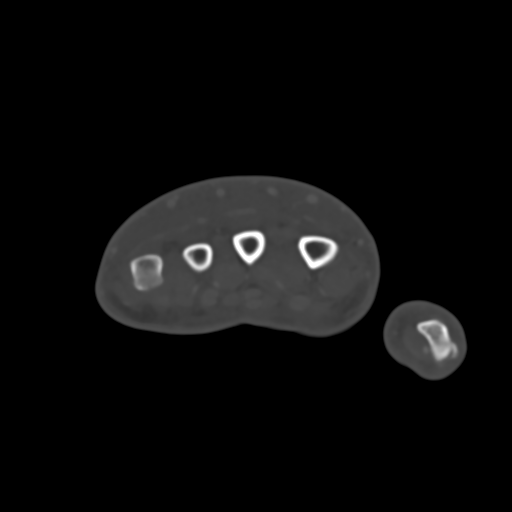

[Series 8: cor soft tissue · coronal · 0.22mm/px · 3 of 49 slices shown]
[im 10/49  bone]
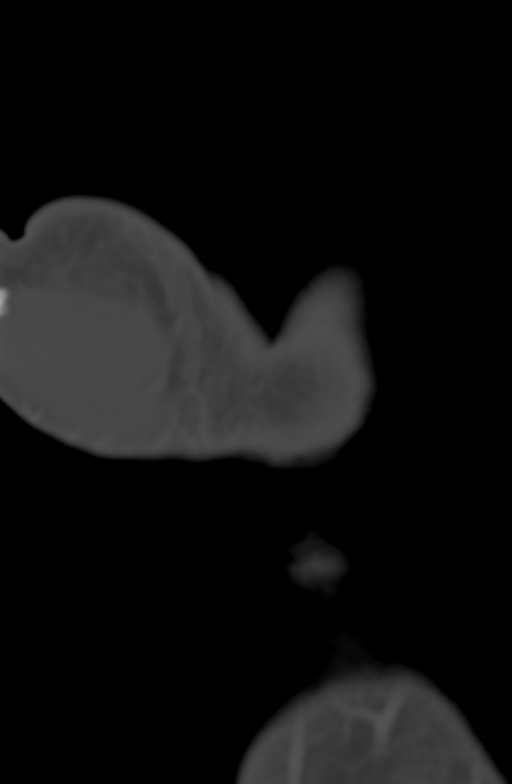
[im 20/49  bone]
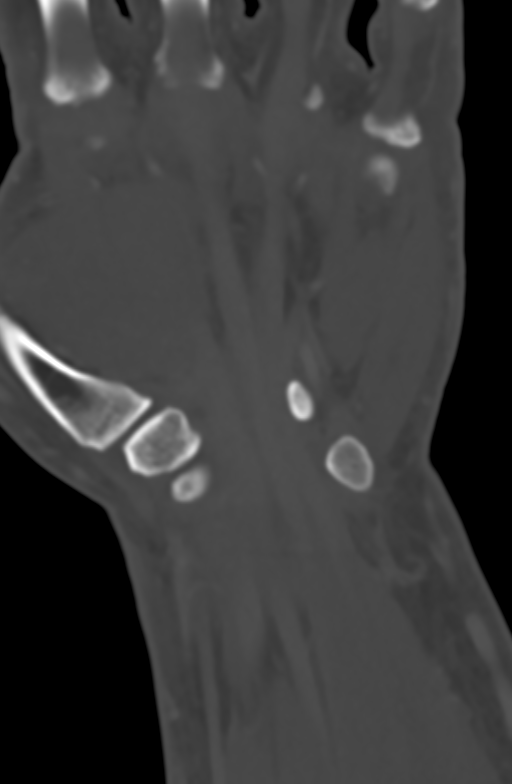
[im 29/49  bone]
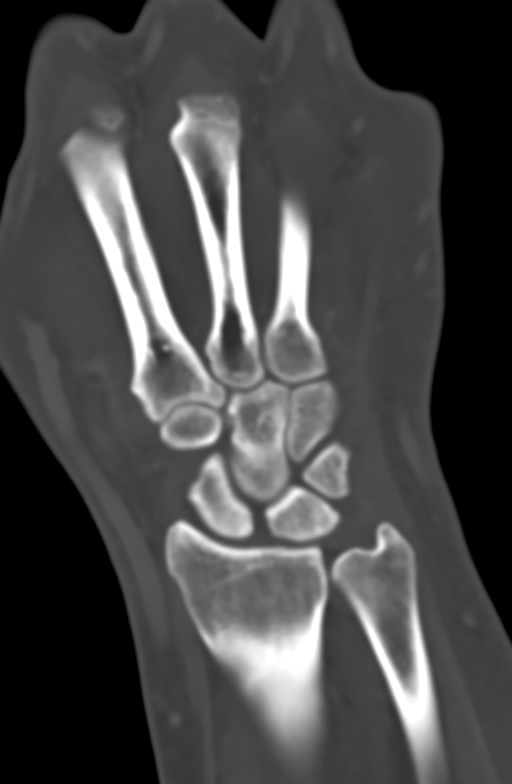

[Series 9: sag soft tissue · sagittal · 0.17mm/px · 5 of 48 slices shown, 6 images]
[im 16/48  bone]
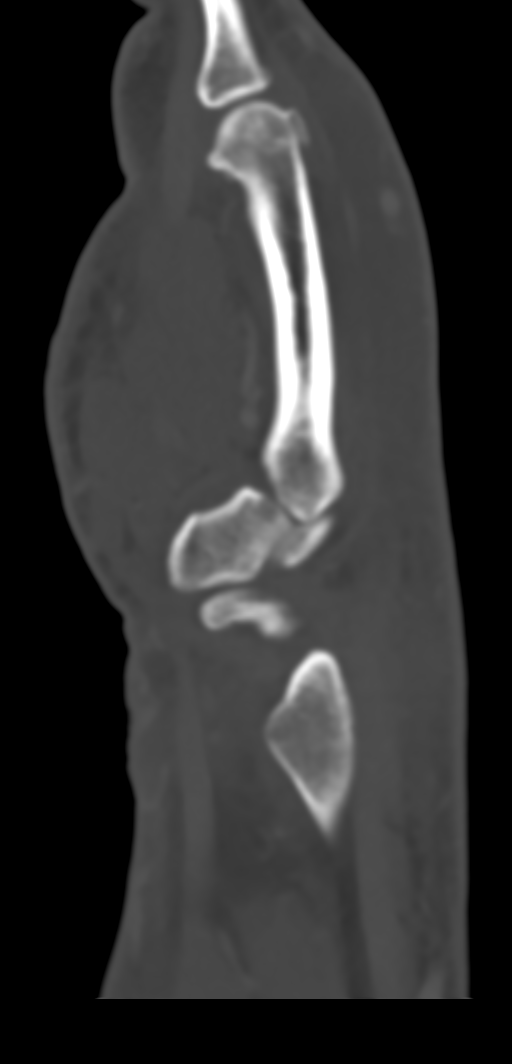
[im 20/48  bone]
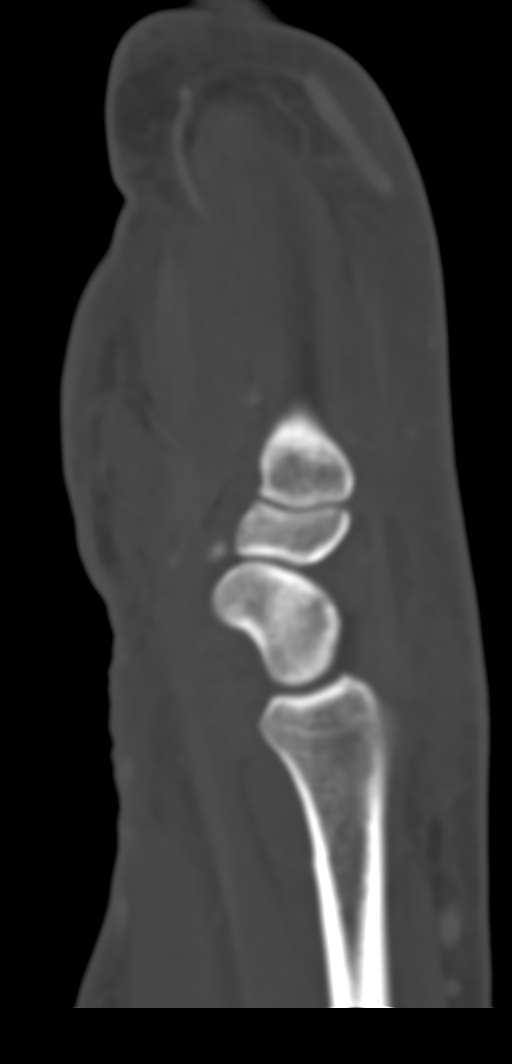
[im 24/48  soft-tissue]
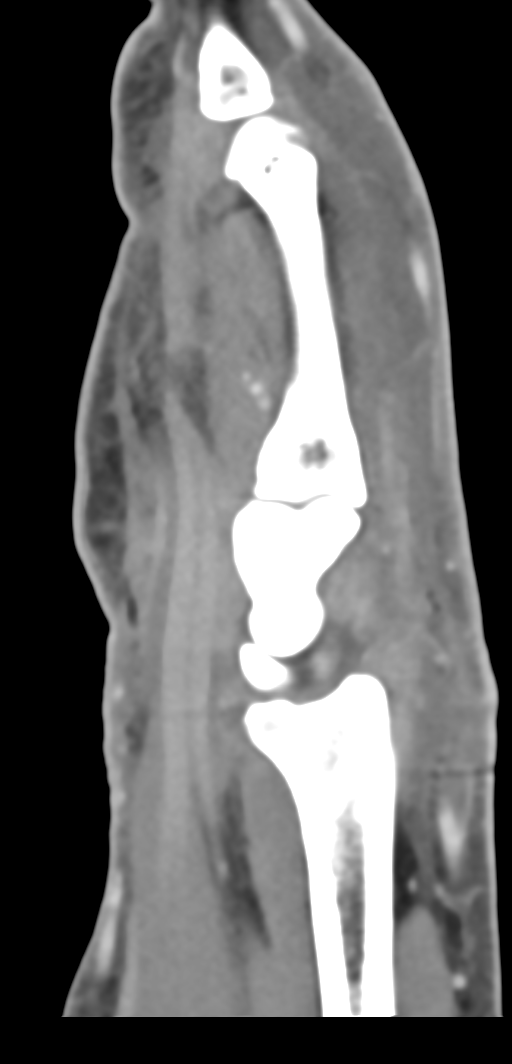
[im 24/48  bone]
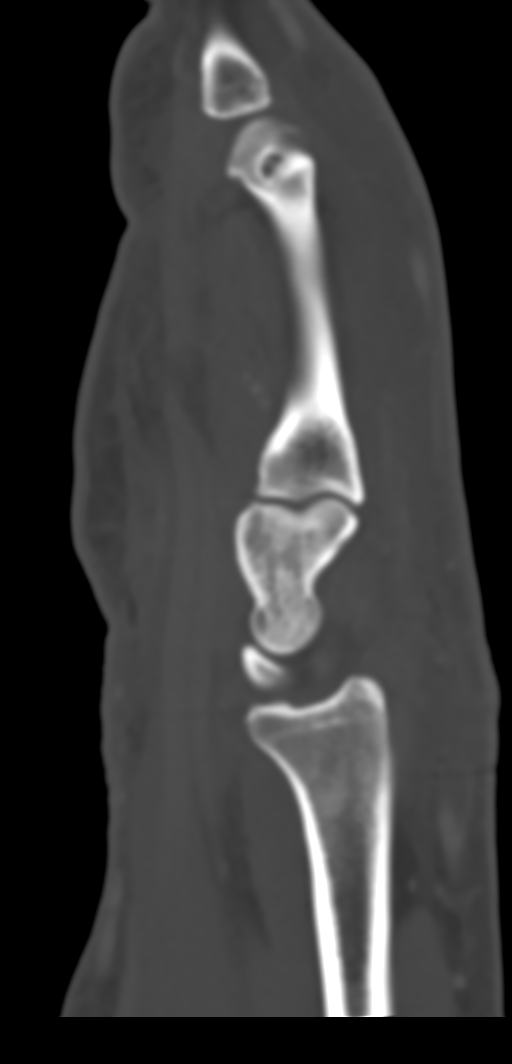
[im 28/48  bone]
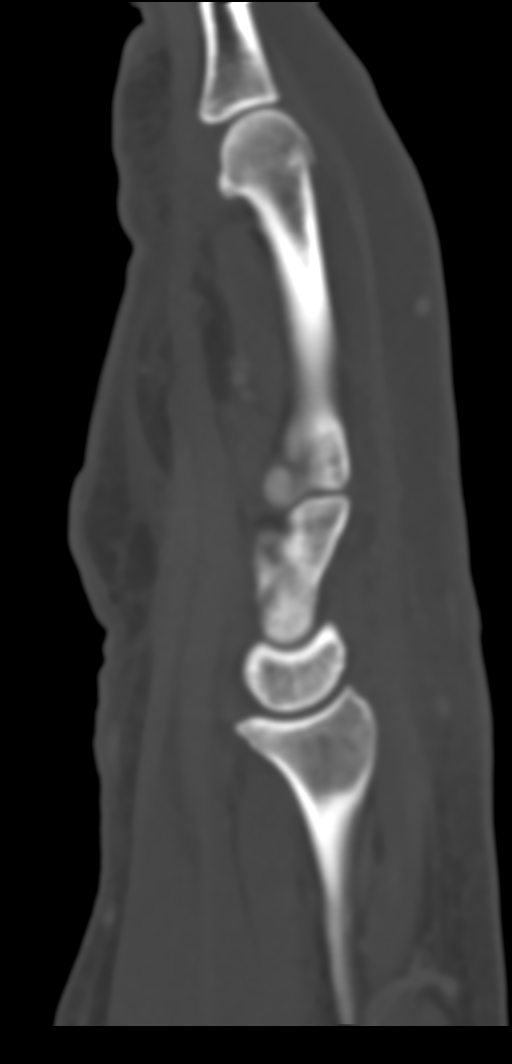
[im 32/48  bone]
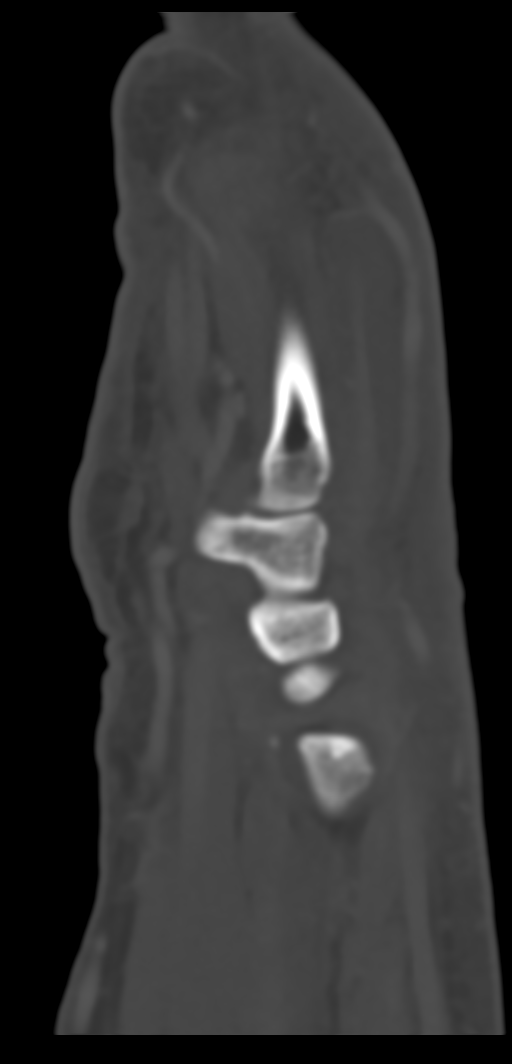

[10 of 34 positions shown; findings below may reference images not displayed]

FINDINGS: Soft tissue swelling noted along the dorsum of the hand and wrist.
Stranding within the subcutaneous soft tissues. No bone destruction
to suggest osteomyelitis. No focal drainable fluid collection. No
fracture, subluxation or dislocation.
IMPRESSION: No acute bony abnormality.  No changes of osteomyelitis.

Diffuse soft tissue swelling along the dorsum of the hand and wrist
without drainable focal fluid collection.

## 2021-01-18 IMAGING — US LEFT UPPER EXTREMITY SOFT TISSUE ULTRASOUND LIMITED
1 series · 14 of 16 positions shown · non-contrast
Comparison: CT left wrist 05/01/2019

CLINICAL DATA: Pain and swelling of the left wrist for 1 week.

EXAM:
ULTRASOUND left UPPER EXTREMITY LIMITED
TECHNIQUE: Ultrasound examination of the upper extremity soft tissues was
performed in the area of clinical concern.

[Series 1: left upper extremity soft tissue ultrasound limite · 16 acquisitions, 14 frames shown]
[im 1/16]
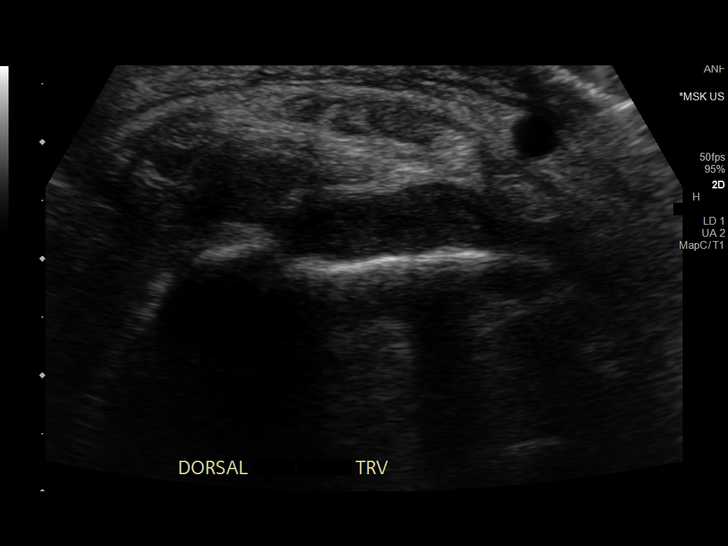
[im 2/16]
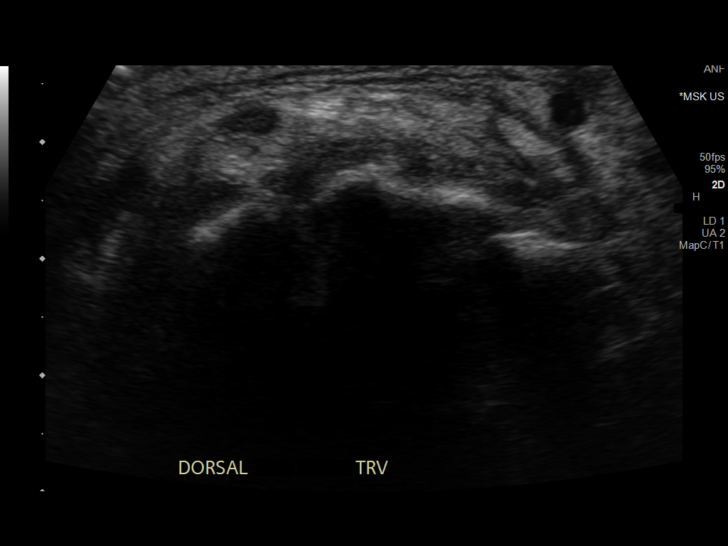
[im 3/16]
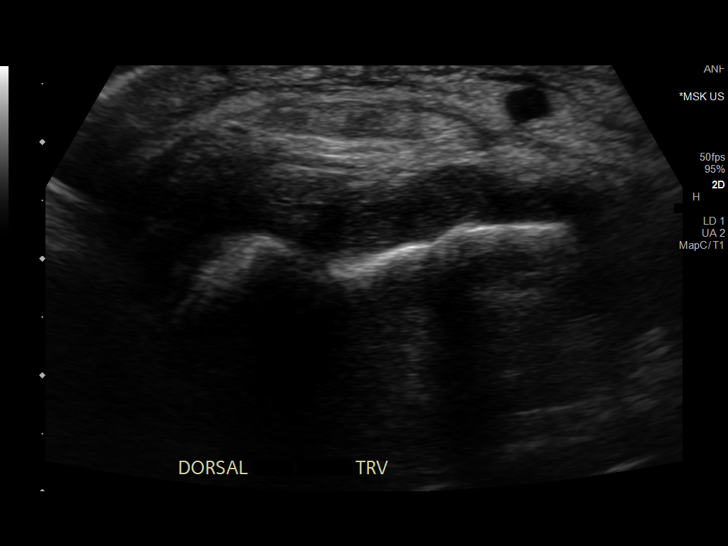
[im 5/16]
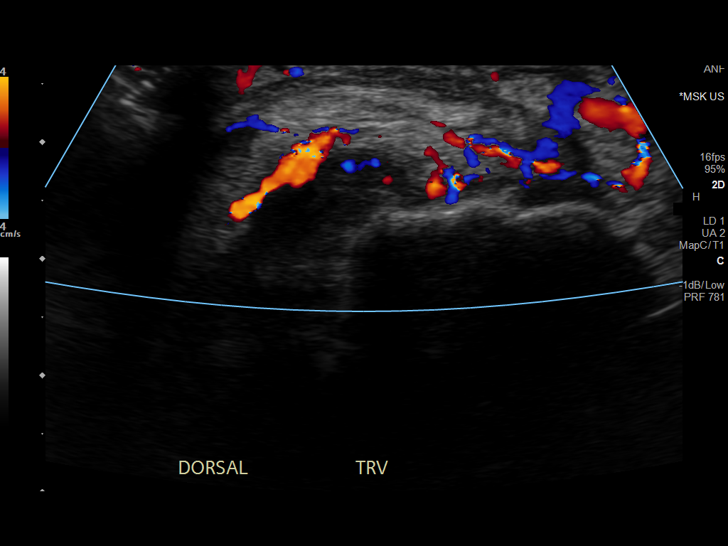
[im 6/16]
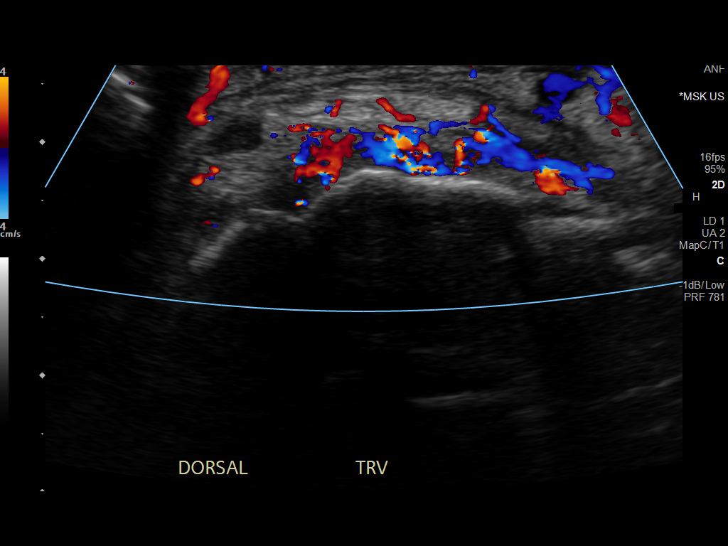
[im 7/16]
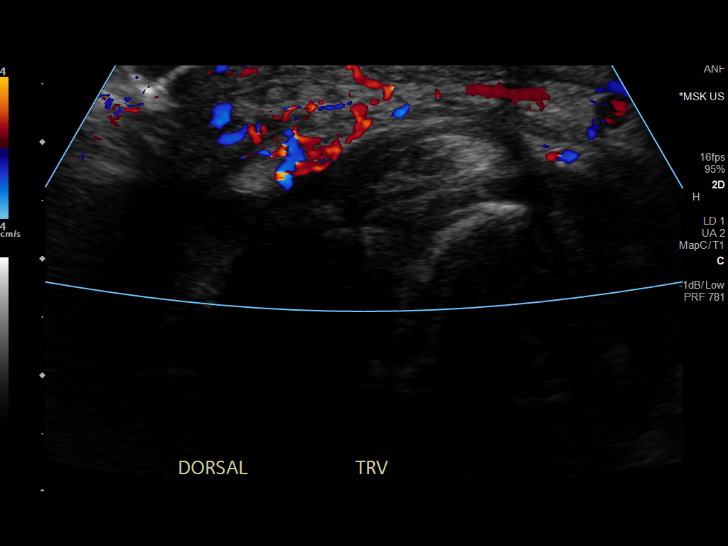
[im 8/16]
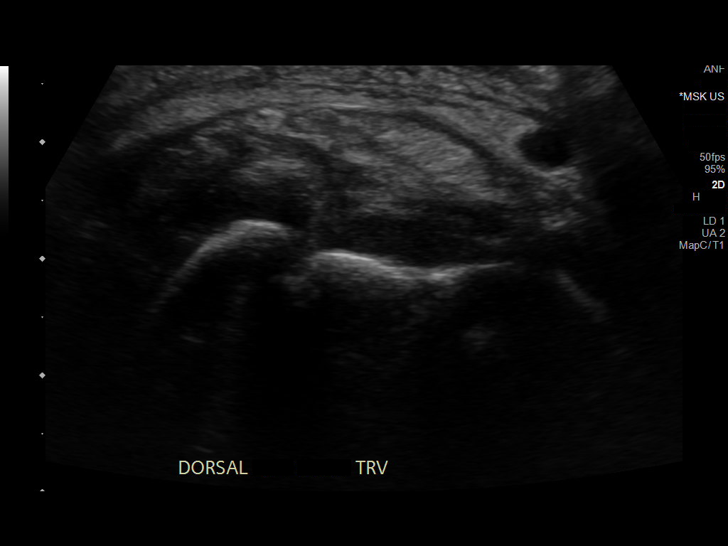
[im 9/16]
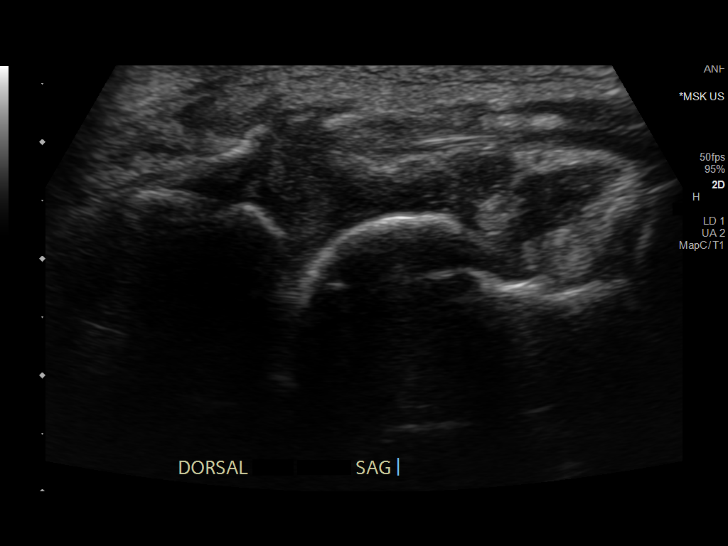
[im 10/16]
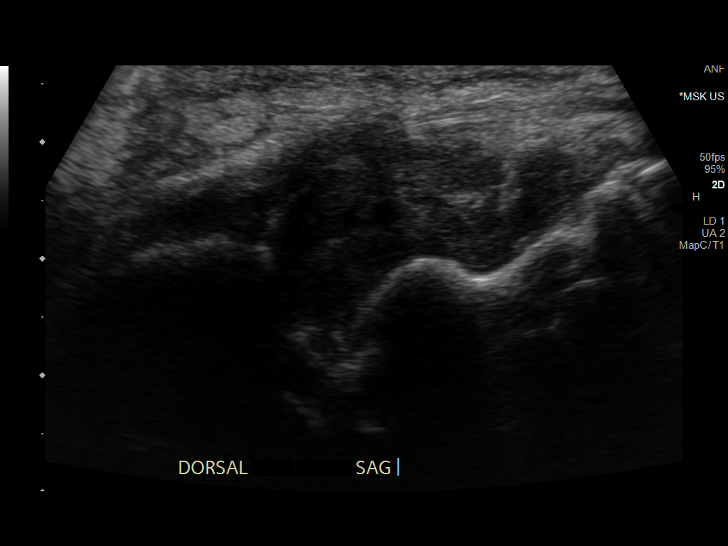
[im 11/16]
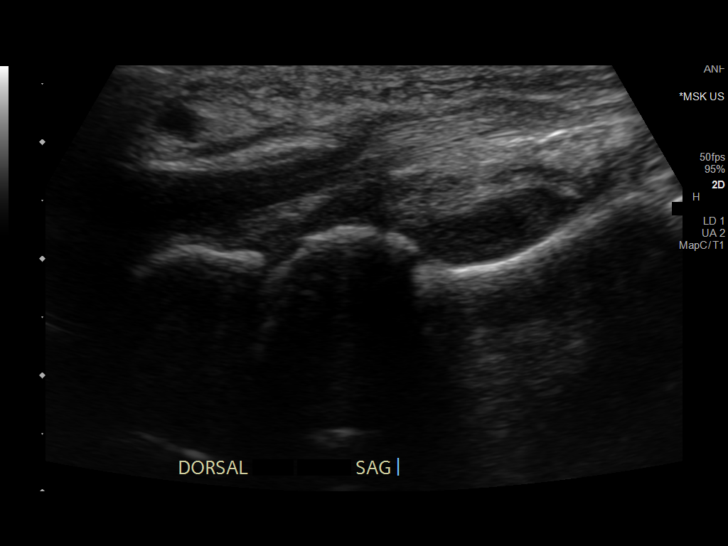
[im 13/16]
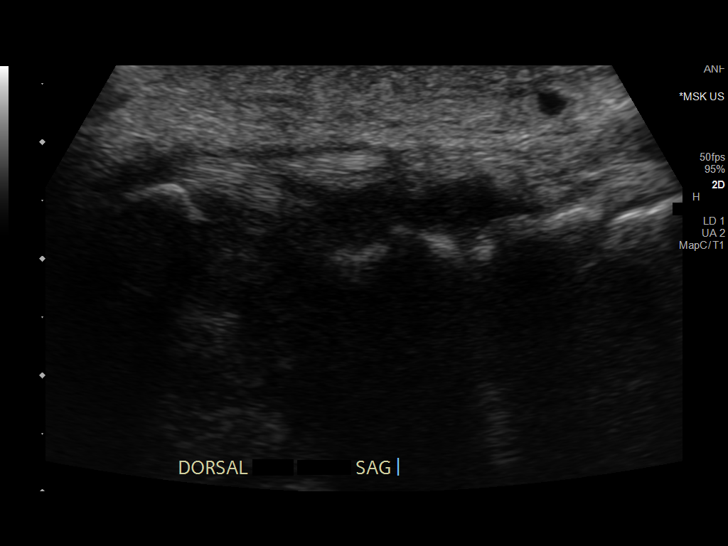
[im 14/16]
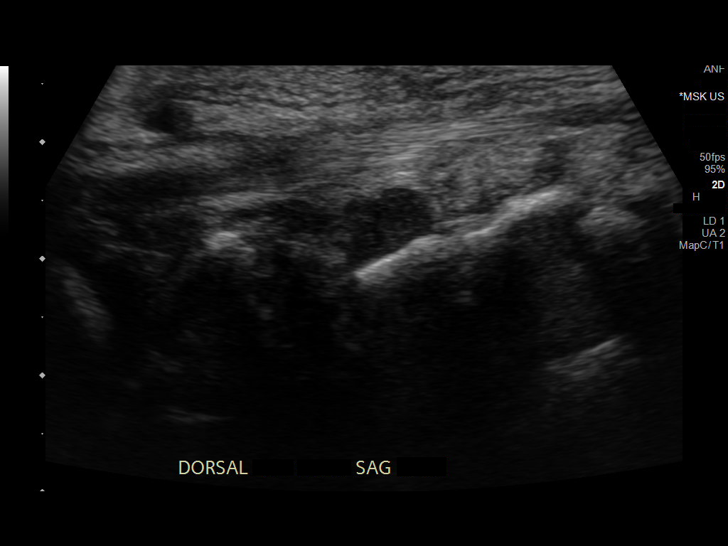
[im 15/16]
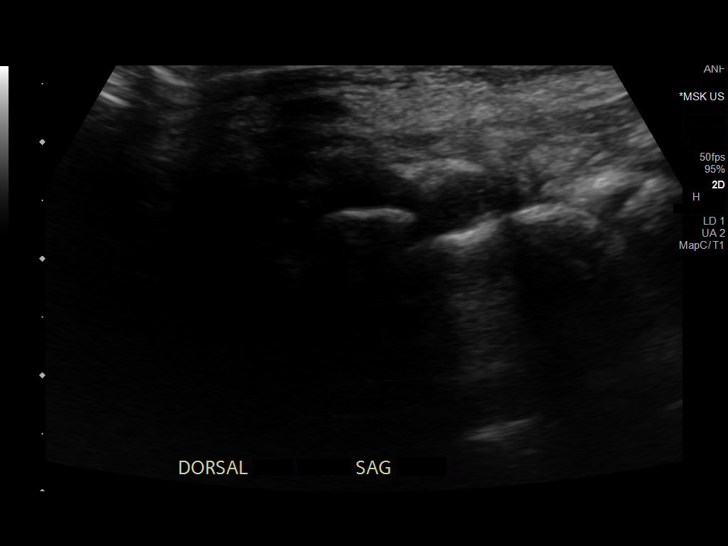
[im 16/16]
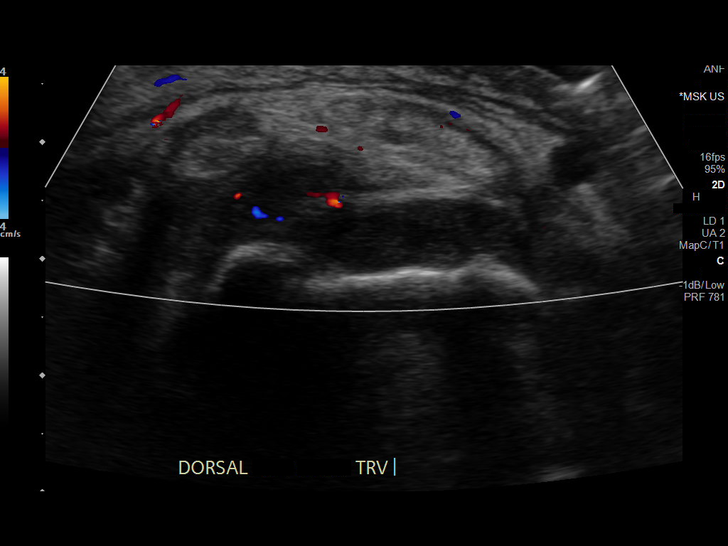

[14 of 16 positions shown; findings below may reference images not displayed]

FINDINGS: Diffuse and fairly marked subcutaneous soft tissue swelling/edema
involving the dorsum of the wrist similar to the recent CT scan. No
focal fluid collection to suggest a drainable soft tissue abscess.

A wrist joint effusion is noted. If there is any clinical concern
for septic arthritis MRI wrist without and with contrast is
recommended for further evaluation.
IMPRESSION: 1. Diffuse and fairly marked subcutaneous soft tissue swelling/edema
involving the dorsum of the wrist and hand without focal fluid
collection to suggest a drainable abscess.
2. Wrist joint effusion suspected. MRI may be helpful for further
evaluation, particularly if septic arthritis is a concern.
